# Patient Record
Sex: Female | Born: 1970 | Race: White | Hispanic: No | Marital: Married | Smoking: Never smoker
Health system: Southern US, Community
[De-identification: ages and names within clinical notes are randomized; demographics above are authoritative.]

## PROBLEM LIST (undated history)

## (undated) DIAGNOSIS — E079 Disorder of thyroid, unspecified: Secondary | ICD-10-CM

## (undated) DIAGNOSIS — R7989 Other specified abnormal findings of blood chemistry: Secondary | ICD-10-CM

## (undated) HISTORY — PX: BACK SURGERY: SHX140

## (undated) HISTORY — DX: Disorder of thyroid, unspecified: E07.9

---

## 2003-05-04 ENCOUNTER — Other Ambulatory Visit: Admission: RE | Admit: 2003-05-04 | Discharge: 2003-05-04 | Payer: Self-pay | Admitting: Obstetrics and Gynecology

## 2004-05-15 ENCOUNTER — Inpatient Hospital Stay (HOSPITAL_COMMUNITY): Admission: AD | Admit: 2004-05-15 | Discharge: 2004-05-18 | Payer: Self-pay | Admitting: Obstetrics and Gynecology

## 2004-05-20 ENCOUNTER — Inpatient Hospital Stay (HOSPITAL_COMMUNITY): Admission: AD | Admit: 2004-05-20 | Discharge: 2004-05-23 | Payer: Self-pay | Admitting: Obstetrics & Gynecology

## 2004-05-21 ENCOUNTER — Encounter: Payer: Self-pay | Admitting: Obstetrics & Gynecology

## 2004-06-17 ENCOUNTER — Other Ambulatory Visit: Admission: RE | Admit: 2004-06-17 | Discharge: 2004-06-17 | Payer: Self-pay | Admitting: Obstetrics & Gynecology

## 2009-12-13 ENCOUNTER — Encounter: Admission: RE | Admit: 2009-12-13 | Discharge: 2009-12-13 | Payer: Self-pay | Admitting: Internal Medicine

## 2010-07-22 ENCOUNTER — Encounter: Admission: RE | Admit: 2010-07-22 | Discharge: 2010-07-22 | Payer: Self-pay | Admitting: Internal Medicine

## 2011-04-14 NOTE — Discharge Summary (Signed)
Debbie Moran, Debbie Moran                        ACCOUNT NO.:  [id number]   MEDICAL RECORD NO.:  [id number]                   PATIENT TYPE:  OUT   LOCATION:  XRAY                                 FACILITY:  [facility]   PHYSICIAN:  Carrington Clamp, M.D.              DATE OF BIRTH:  [birth year]   DATE OF ADMISSION:  05/15/2004  DATE OF DISCHARGE:  05/18/2004                                 DISCHARGE SUMMARY   FINAL DIAGNOSES:  1. Intrauterine pregnancy at 38+ weeks gestation.  2. Mild preeclampsia.  3. Suspected macrosomia.   PROCEDURES:  Primary low transverse cesarean section.   SURGEON:  Gerrit Friends. Aldona Bar, M.D.   COMPLICATIONS:  None.   HISTORY OF PRESENT ILLNESS:  This 40 year old G2, P0 presents at [redacted] weeks  gestation with elevated blood pressures.  The patient had been referred from  the [facility] where she was seen that morning, in  addition to her elevated blood pressures, there was a clinical suspicion of  macrosomia.  This had been discussed with the patient at her office visit  last week which showed an estimated fetal weight of about 7 pounds 8 ounces.  This was discussed with the patient, and she had pretty much decided on a  primary low transverse cesarean section as her source of delivery.  The  patient's antepartum course had been complicated by a history of  thrombophilia.  The rest of her workup was essentially negative, but she was  on aspirin until early June.  She had not had any in the last three weeks.  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  Otherwise, the  patient's antepartum course really had been uncomplicated.  She was admitted  at this time.  She did have some 1+ lower extremity edema upon admission.  The patient's [facility] panel was essentially normal.  She was taken to  the  operating room on May 15, 2004, by Dr. Annamaria Helling where a primary low  transverse cesarean section was performed with the delivery of a 7 pound 14  ounce female infant with Apgars of 9 and 9.  Delivery went without  complications.  The patient's postoperative course was benign without  significant fevers.  Her blood pressures were still slightly elevated, but  no other symptoms were present.  The patient did desire her little boy to be  circumcised which was taken care of during her hospital stay.  She also was  having some shooting pain down her right leg and was seen by anesthesia.  The patient's questions were answered and were told to follow up with her if  the pain continued beyond 2 weeks.  By postoperative day #3, the patient's  blood pressures were  still elevated, but she was given the okay to be  discharged.  She was sent home on a regular diet with decreased activity,  told to continue her prenatal vitamins and her iron supplements one daily.  She was given Percocet 1-2 q.4h. p.r.n. pain.  Was to follow up in the  office with Dr. Henderson Cloud in 4 weeks, and of course, to call with any  increased pain or any temperatures.   DISCHARGE LABORATORIES:  PIH panel was within normal limits.  She had  hemoglobin of 8.6 on June 20, postoperatively, and white blood cell count of  10.9.  Platelets after delivery were 334,000.     Leilani Able, P.A.-C.                Carrington Clamp, M.D.    MB/MEDQ  D:  06/06/2004  T:  06/06/2004  Job:  [id number]

## 2011-04-14 NOTE — Consult Note (Signed)
NAMEJOLYNN, Debbie Moran                        ACCOUNT NO.:  [id number]   MEDICAL RECORD NO.:  [id number]                   PATIENT TYPE:  INP   LOCATION:  9373                                 FACILITY:  [facility]   PHYSICIAN:  Currie Paris, M.D.           DATE OF BIRTH:  1971-06-07   DATE OF CONSULTATION:  05/21/2004  DATE OF DISCHARGE:                                   CONSULTATION   REASON FOR CONSULTATION:  Questionable abdominal problem.   HISTORY OF PRESENT ILLNESS:  Debbie Moran is a 40 year old lady who is about 1  week status post cesarean section. This apparently was fairly routine. She  went home. Was doing well until yesterday afternoon about 1:30 when she  developed increasing abdominal discomfort across the entire upper abdomen,  more right upper quadrant that left upper quadrant. The pain seemed to go up  towards her right shoulder. Certain movements would increase it and send  pain sharply up into her right chest. Deep inspiration also hurt such that  she was taking shallow respirations. The patient felt like some of her pain  was gas and describes it all across the upper abdomen. None really in the  lower abdomen. She has had a small bowel movement today. Felt a little bit  better after that. The patient denies any nausea and vomiting. She has felt  warm and noted to be febrile since she arrived here. The pain has persisted  all day today. She feels much better sitting up and changing position from  sitting to supine on her back, which requires a great deal of effort and she  has to do this very slowly. She really has not had any other symptoms prior  to this. She has had no urinary tract symptoms. She had no blood in her  stools. She had no abnormal vaginal bleeding since her baby came a week ago.   PAST MEDICAL HISTORY:  Unremarkable, noted in the old chart. Not re-dictated  here.   REVIEW OF SYSTEMS:  Likewise basically unremarkable.   PHYSICAL EXAMINATION:   GENERAL:  The patient is alert, oriented,  uncomfortable appearing sitting in a chair. Slightly dyspneic.  VITAL SIGNS:  Have shown a persistent tachycardia with a tachypnea of about  25.  HEENT:  Eyes nonicteric. Pupils are equal, round, and regular.  NECK:  Supple. No masses or thyromegaly.  LUNGS:  She has some decreased breath sound bilaterally but basically clear.  Somewhat shallow respirations.  HEART:  Rate is regular. No murmur, rub, or gallop. Does have as noted, a  tachycardia.  ABDOMEN: Distended and bloated. The wound appears to be healing nicely from  her cesarean section (Pfannenstiel incision). Her abdomen seems soft without  a lot of guarding but pressure on her abdomen, almost just any place, causes  pain up in the right upper quadrant and up into the right chest. She has  question of some rebound but  not certain of this. Bowel sounds are present.  EXTREMITIES:  Just a little bit of peripheral edema. This is minimal.   LABORATORY DATA:  A pH of 745, pO2 of 87 (saturations have been around 97%).  CBC initially showed a white count of 24,000 and platelet of 600,000. She is  known to have a high platelet count in the past. Repeat CBC this afternoon,  white count was up to 29,000.   Chest x-ray showed some mild cardiomegaly and vascular congestion. A spiral  CT of the chest showed no evidence of pulmonary emboli. No evidence of  pneumonias or other vascular problems. The top of the liver was visible and  there may have been a tiny bit of fluid around it. The gallbladder was a  little bit distended but there is no evidence of inflammatory process around  that. The kidneys did function and only the tops of them were seen. No  significant abdominal process could be seen, as the CT really did not go low  enough. KUB just showed a mild ileus pattern, basically unremarkable. Pelvic  and upper abdominal ultrasounds are negative. There is no evidence of  gallstones and no evidence  of thick walled gallbladder or cholecystitis.   IMPRESSION:  Acute abdominal pain with radiation into the right chest of  uncertain etiology.   PLAN:  She is already on intravenous fluids and antibiotics. I have  recommended that we go ahead with a CT scan as the next step, to see if this  will help [place]. I have some concern that she could have perhaps ischemic bowel  with her high platelet count and perhaps, some coagulopathy increased  clotting disorder. A CT might be helpful for that. She does not appear to  have any, as noted, pneumonia, pulmonary embolus, etc. I will follow her  again, once her CT is done.                                               Currie Paris, M.D.    CJS/MEDQ  D:  05/21/2004  T:  05/22/2004  Job:  [id number]

## 2011-04-14 NOTE — Op Note (Signed)
[name], Debbie Moran                        ACCOUNT NO.:  [id number]   MEDICAL RECORD NO.:  [id number]                   PATIENT TYPE:  INP   LOCATION:  9198                                 FACILITY:  [facility]   PHYSICIAN:  Gerrit Friends. Aldona Bar, M.D.                DATE OF BIRTH:  10-03-1971   DATE OF PROCEDURE:  05/15/2004  DATE OF DISCHARGE:                                 OPERATIVE REPORT   PREOPERATIVE DIAGNOSES:  1. Thirty-eight week to 39-week intrauterine pregnancy.  2. Mild preeclampsia.  3. Suspected macrosomia.   POSTOPERATIVE DIAGNOSES:  1. Thirty-eight week to 39-week intrauterine pregnancy.  2. Mild preeclampsia.  3. Suspected macrosomia.  4. Delivery of a 7 pound 14 ounce female infant, Apgars 9 and 9.   SURGEON:  Gerrit Friends. Aldona Bar, M.D.   PROCEDURE:  Primary low transverse cesarean section.   ANESTHESIA:  Spinal, Dorinda Hill T. Pamalee Leyden, M.D.   HISTORY:  This 40 year old gravida 2, para 0, was seen by me in triage for  continued evaluation of mild preeclampsia, having been referred from the  [facility] urgent care center, where she was seen this morning.  In  addition to mild preeclampsia, there was a clinical suspicion of macrosomia  and, indeed, this had been discussed with the patient in the office last  week in detail after an ultrasound two weeks ago suggested estimated fetal  weight of 7 pounds 8 ounces.  The patient was pretty much decided on a  primary low transverse cesarean section for delivery, and this as well as  induction were discussed thoroughly in the maternity admissions unit and  cesarean section decided upon.  She is taken to the operating room now for a  primary low transverse cesarean section.   PROCEDURE:  The patient was taken to the operating room, where after the  satisfactory induction of a spinal anesthetic by Dr. Pamalee Leyden, she was prepped  and draped in the usual fashion having been placed in the supine position  slightly tilted to the left and a  Foley catheter was inserted as part of the  prep.   After the patient was adequately draped and good anesthetic levels were  documented, the procedure was begun.  A Pfannenstiel incision was made, with  minimal difficulty dissected down sharply to and through the fascia in a low  transverse fashion.  Subfascial space was created inferiorly and superiorly,  muscles separated in the midline, peritoneum identified and entered  appropriately with care taken to avoid the bowel superiorly and the bladder  inferiorly.  At this time the bladder blade was placed and the vesicouterine  peritoneum was incised in a low transverse fashion and pushed off the lower  uterine segment with ease.  Sharp incision in the uterus with the Metzenbaum  scissors was then made in a low transverse fashion.  Amniotomy was produced  with production of clear fluid, and the incision was extended  laterally with  the fingers.  Thereafter, with the aid of a vacuum extractor a viable female  infant which cried spontaneously at once was delivered from the vertex  position.  After the cord was clamped and cut, the infant was passed off to  the awaiting team and subsequently taken to the nursery in good condition.  Apgars were noted to be 9 and 9, and subsequent weight was found to be 7  pounds 14 ounces.   After the cord bloods were collected, the placenta was delivered intact.  The uterus was then exteriorized and good uterine contractility was afforded  with slowly-given intravenous Pitocin and manual stimulation.  At this time  closure of the uterine incision was carried out using a single layer of #1  Vicryl in a running locking fashion, and this was oversewn with several  figure-of-eight #1 Vicryls.  The vesicouterine peritoneum reflection was  reapproximated as well using several interrupted 0 Vicryls.  Tubes and  ovaries were inspected and noted to be normal and with the uterine incision  hemostatic, the abdomen was  lavaged of all free blood and clot and then the  uterus was replaced into the abdominal cavity.  At this point all counts  were noted to be correct and no foreign bodies were noted to be remaining in  the abdominal cavity.  Closure of the abdomen at this time was begun in  layers.  The abdominal peritoneum was closed with 0 Vicryl in a running  fashion and muscle secured with same.  Assured of good subfascial  hemostasis, the fascia was then reapproximated using 0 Vicryl from angle to  the midline bilaterally.  Subcutaneous tissue was rendered hemostatic and  staples were then used to close the skin.  A sterile pressure dressing was  applied, and the patient was taken to the recovery room thereafter having  tolerated the procedure well.  Estimated blood loss 500 mL.  All counts  correct x2.  At the conclusion of the procedure both mother and baby were  doing well in their respective recovery areas.   In summary, this patient presented with mild preeclampsia at 38 to 39 weeks'  gestation and a clinical suspicion for macrosomia.  A discussion with the  patient and her husband was carried out as had been carried out last week in  the office, and a decision was made to proceed with a primary low transverse  cesarean section primarily because of suspected macrosomia.  The procedure  was carried out without incident, and, as mentioned at the conclusion of the  procedure, both mother and baby were doing well in their respective recovery  areas.                                               Gerrit Friends. Aldona Bar, M.D.    RMW/MEDQ  D:  05/15/2004  T:  05/16/2004  Job:  [id number]

## 2011-04-14 NOTE — Discharge Summary (Signed)
Debbie Moran, Debbie Moran                        ACCOUNT NO.:  [id number]   MEDICAL RECORD NO.:  [id number]                   PATIENT TYPE:  INP   LOCATION:  9373                                 FACILITY:  [facility]   PHYSICIAN:  Gerrit Friends. Aldona Bar, M.D.                DATE OF BIRTH:  10-23-1971   DATE OF ADMISSION:  05/20/2004  DATE OF DISCHARGE:                                 DISCHARGE SUMMARY   DISCHARGE DIAGNOSIS:  Postpartum endometritis (?) presenting as right upper  quadrant pain, tachypnea, elevated white count, and fever.   SUMMARY:  This 40 year old patient who underwent a cesarean section on May 15, 2004 presented on the evening of June 24 with right upper quadrant pain,  tachypnea, tachycardia, and leucocytosis.  She had been discharged from the  hospital several days earlier.  On evaluation it seemed that most of her  discomfort was in her right upper quadrant area.  She underwent a very  thorough workup including a spiral CT to rule out a pulmonary embolus, an  abdominal and pelvic ultrasound, an abdominal CT scan, surgical  consultation, and cardiology consultation.  She was placed on intravenous  Unasyn 3 g q.6h. intravenously and also given Lasix to help her diurese and  potassium to correct her hypokalemia, and improved markedly over the course  of her hospital stay.  Her blood cultures and urine culture on the morning  of June 27 were negative.  Other than her hemoglobin and white count and low  potassium, her labs were within normal limits.  Her white count which  initially was 29,400 on June 25 dropped to 10,800 on the morning of June 27  and her hemoglobin remained stable at 7.9.  Her platelet count was still  elevated.  Her basic metabolic profile on the morning of June 27 was normal.  As mentioned, the patient improved dramatically once she was placed on  Unasyn and IV fluids and Lasix.  She was doing so well on the morning of  June 27 that she and her husband both were  desirous of discharge and after  her noon dose of Unasyn intravenously she was discharged to home to continue  on Augmentin XR 1000 mg q.12h. for a total of 5 more days.  She was  cautioned on what to watch out for and what to call about.  She will  continue her postpartum care at home and return to the office as scheduled  for her postpartum visit.   As mentioned, with the response of her fever and her white count to  intravenous Unasyn, infection probably was the etiology of her problem;  however, her presentation was indeed unusual.  Her workup was very thorough  and as mentioned included consultations with general surgery and cardiology.   CONDITION ON DISCHARGE:  Improved.  Gerrit Friends. Aldona Bar, M.D.    RMW/MEDQ  D:  05/23/2004  T:  05/23/2004  Job:  [id number]   cc:   Currie Paris, M.D.  [address], [address]  [place]  [place] [address]  Fax: [phone]   Lyn Records III, M.D.  [address]  [place]  [place] [address]  Fax: [phone]   Ilda Mori, M.D.  [address]  [place], [place] [address]  Fax: [phone]

## 2011-12-18 ENCOUNTER — Emergency Department (HOSPITAL_COMMUNITY): Payer: BC Managed Care – PPO | Admitting: Anesthesiology

## 2011-12-18 ENCOUNTER — Encounter (HOSPITAL_COMMUNITY): Payer: Self-pay | Admitting: Emergency Medicine

## 2011-12-18 ENCOUNTER — Inpatient Hospital Stay (HOSPITAL_COMMUNITY)
Admission: EM | Admit: 2011-12-18 | Discharge: 2011-12-19 | DRG: 883 | Disposition: A | Discharge status: Home / Self Care | Payer: BC Managed Care – PPO | Attending: General Surgery | Admitting: General Surgery

## 2011-12-18 ENCOUNTER — Encounter (HOSPITAL_COMMUNITY)
Admission: EM | Disposition: A | Discharge status: Home / Self Care | Payer: Self-pay | Source: Home / Self Care | Attending: Surgery

## 2011-12-18 ENCOUNTER — Encounter (HOSPITAL_COMMUNITY): Payer: Self-pay | Admitting: Anesthesiology

## 2011-12-18 ENCOUNTER — Other Ambulatory Visit: Payer: Self-pay

## 2011-12-18 ENCOUNTER — Other Ambulatory Visit (INDEPENDENT_AMBULATORY_CARE_PROVIDER_SITE_OTHER): Payer: Self-pay | Admitting: Surgery

## 2011-12-18 DIAGNOSIS — R1011 Right upper quadrant pain: Secondary | ICD-10-CM

## 2011-12-18 DIAGNOSIS — K3580 Unspecified acute appendicitis: Secondary | ICD-10-CM

## 2011-12-18 DIAGNOSIS — R1031 Right lower quadrant pain: Secondary | ICD-10-CM

## 2011-12-18 DIAGNOSIS — K37 Unspecified appendicitis: Secondary | ICD-10-CM | POA: Diagnosis present

## 2011-12-18 DIAGNOSIS — D473 Essential (hemorrhagic) thrombocythemia: Secondary | ICD-10-CM | POA: Diagnosis present

## 2011-12-18 HISTORY — PX: LAPAROSCOPIC APPENDECTOMY: SHX408

## 2011-12-18 HISTORY — DX: Other specified abnormal findings of blood chemistry: R79.89

## 2011-12-18 LAB — URINALYSIS, ROUTINE W REFLEX MICROSCOPIC
Bilirubin Urine: NEGATIVE
Hgb urine dipstick: NEGATIVE
Ketones, ur: NEGATIVE mg/dL
Protein, ur: NEGATIVE mg/dL
Specific Gravity, Urine: <1.005 — ABNORMAL LOW (ref 1.005–1.030)
Urobilinogen, UA: 0.2 mg/dL (ref 0.0–1.0)

## 2011-12-18 LAB — CBC
HCT: 39.7 % (ref 36.0–46.0)
Hemoglobin: 13.3 g/dL (ref 12.0–15.0)
MCH: 28.2 pg (ref 26.0–34.0)
MCHC: 33.5 g/dL (ref 30.0–36.0)
MCV: 84.3 fL (ref 78.0–100.0)
Platelets: 508 10*3/uL — ABNORMAL HIGH (ref 150–400)
RDW: 13.5 % (ref 11.5–15.5)

## 2011-12-18 LAB — COMPREHENSIVE METABOLIC PANEL
Alkaline Phosphatase: 128 U/L — ABNORMAL HIGH (ref 39–117)
Calcium: 9.4 mg/dL (ref 8.4–10.5)
Creatinine, Ser: 0.66 mg/dL (ref 0.50–1.10)
GFR calc non Af Amer: >90 mL/min (ref >90)
Sodium: 134 mEq/L — ABNORMAL LOW (ref 135–145)

## 2011-12-18 SURGERY — APPENDECTOMY, LAPAROSCOPIC
Anesthesia: General | Site: Abdomen | Wound class: Clean Contaminated

## 2011-12-18 MED ORDER — MORPHINE SULFATE 4 MG/ML IJ SOLN
4.0000 mg | Freq: Once | INTRAMUSCULAR | Status: AC
  Administered 2011-12-18: 4 mg via INTRAVENOUS
  Filled 2011-12-18: qty 1

## 2011-12-18 MED ORDER — MIDAZOLAM HCL 5 MG/5ML IJ SOLN
INTRAMUSCULAR | Status: DC | PRN
  Administered 2011-12-18: 2 mg via INTRAVENOUS

## 2011-12-18 MED ORDER — DEXAMETHASONE SODIUM PHOSPHATE 4 MG/ML IJ SOLN
INTRAMUSCULAR | Status: DC | PRN
  Administered 2011-12-18: 4 mg via INTRAVENOUS

## 2011-12-18 MED ORDER — HYDROCODONE-ACETAMINOPHEN 5-325 MG PO TABS: 1.0000 | ORAL_TABLET | ORAL | Status: DC | PRN

## 2011-12-18 MED ORDER — GLYCOPYRROLATE 0.2 MG/ML IJ SOLN
INTRAMUSCULAR | Status: DC | PRN
  Administered 2011-12-18: .4 mg via INTRAVENOUS

## 2011-12-18 MED ORDER — IBUPROFEN 600 MG PO TABS
600.0000 mg | ORAL_TABLET | Freq: Four times a day (QID) | ORAL | Status: DC | PRN
  Filled 2011-12-18: qty 1

## 2011-12-18 MED ORDER — NEOSTIGMINE METHYLSULFATE 1 MG/ML IJ SOLN
INTRAMUSCULAR | Status: DC | PRN
  Administered 2011-12-18: 2 mg via INTRAVENOUS

## 2011-12-18 MED ORDER — SUCCINYLCHOLINE CHLORIDE 20 MG/ML IJ SOLN
INTRAMUSCULAR | Status: DC | PRN
  Administered 2011-12-18: 100 mg via INTRAVENOUS

## 2011-12-18 MED ORDER — DROPERIDOL 2.5 MG/ML IJ SOLN
INTRAMUSCULAR | Status: DC | PRN
  Administered 2011-12-18: .6 mg via INTRAVENOUS

## 2011-12-18 MED ORDER — LEVOTHYROXINE SODIUM 50 MCG PO TABS
50.0000 ug | ORAL_TABLET | Freq: Every day | ORAL | Status: DC
  Administered 2011-12-19: 50 ug via ORAL
  Filled 2011-12-18 (×2): qty 1

## 2011-12-18 MED ORDER — ACETAMINOPHEN 10 MG/ML IV SOLN
INTRAVENOUS | Status: AC
  Filled 2011-12-18: qty 100

## 2011-12-18 MED ORDER — DEXTROSE 5 % IV SOLN
1.0000 g | INTRAVENOUS | Status: DC | PRN
  Administered 2011-12-18: 2 g via INTRAVENOUS

## 2011-12-18 MED ORDER — ACETAMINOPHEN 10 MG/ML IV SOLN
INTRAVENOUS | Status: DC | PRN
  Administered 2011-12-18: 1000 mg via INTRAVENOUS

## 2011-12-18 MED ORDER — SODIUM CHLORIDE 0.9 % IR SOLN
Status: DC | PRN
  Administered 2011-12-18: 1

## 2011-12-18 MED ORDER — ONDANSETRON HCL 4 MG/2ML IJ SOLN
INTRAMUSCULAR | Status: DC | PRN
  Administered 2011-12-18: 4 mg via INTRAVENOUS

## 2011-12-18 MED ORDER — KCL IN DEXTROSE-NACL 20-5-0.45 MEQ/L-%-% IV SOLN
INTRAVENOUS | Status: DC
  Administered 2011-12-18: via INTRAVENOUS
  Filled 2011-12-18 (×3): qty 1000

## 2011-12-18 MED ORDER — ONDANSETRON HCL 4 MG PO TABS: 4.0000 mg | ORAL_TABLET | Freq: Four times a day (QID) | ORAL | Status: DC | PRN

## 2011-12-18 MED ORDER — BUPIVACAINE HCL (PF) 0.25 % IJ SOLN
INTRAMUSCULAR | Status: DC | PRN
  Administered 2011-12-18: 20 mL

## 2011-12-18 MED ORDER — VECURONIUM BROMIDE 10 MG IV SOLR
INTRAVENOUS | Status: DC | PRN
  Administered 2011-12-18: 3 mg via INTRAVENOUS
  Administered 2011-12-18: 1 mg via INTRAVENOUS

## 2011-12-18 MED ORDER — CEFOXITIN SODIUM 1 G IV SOLR
1.0000 g | Freq: Four times a day (QID) | INTRAVENOUS | Status: DC
  Administered 2011-12-18 – 2011-12-19 (×2): 1 g via INTRAVENOUS
  Filled 2011-12-18 (×4): qty 1

## 2011-12-18 MED ORDER — ONDANSETRON HCL 4 MG/2ML IJ SOLN: 4.0000 mg | Freq: Four times a day (QID) | INTRAMUSCULAR | Status: DC | PRN

## 2011-12-18 MED ORDER — MORPHINE SULFATE 2 MG/ML IJ SOLN
1.0000 mg | INTRAMUSCULAR | Status: DC | PRN
  Administered 2011-12-18 – 2011-12-19 (×3): 2 mg via INTRAVENOUS
  Filled 2011-12-18 (×3): qty 1

## 2011-12-18 MED ORDER — ONDANSETRON HCL 4 MG/2ML IJ SOLN: 4.0000 mg | Freq: Once | INTRAMUSCULAR | Status: DC | PRN

## 2011-12-18 MED ORDER — PROPOFOL 10 MG/ML IV BOLUS
INTRAVENOUS | Status: DC | PRN
  Administered 2011-12-18: 200 mg via INTRAVENOUS

## 2011-12-18 MED ORDER — FENTANYL CITRATE 0.05 MG/ML IJ SOLN: INTRAMUSCULAR | Status: DC | PRN

## 2011-12-18 MED ORDER — FENTANYL CITRATE 0.05 MG/ML IJ SOLN
INTRAMUSCULAR | Status: DC | PRN
Start: 2011-12-18 — End: 2011-12-18
  Administered 2011-12-18: 75 ug via INTRAVENOUS
  Administered 2011-12-18: 175 ug via INTRAVENOUS
  Administered 2011-12-18: 100 ug via INTRAVENOUS

## 2011-12-18 MED ORDER — EPHEDRINE SULFATE 50 MG/ML IJ SOLN
INTRAMUSCULAR | Status: DC | PRN
  Administered 2011-12-18: 15 mg via INTRAVENOUS

## 2011-12-18 MED ORDER — HYDROMORPHONE HCL PF 1 MG/ML IJ SOLN: 0.2500 mg | INTRAMUSCULAR | Status: DC | PRN

## 2011-12-18 MED ORDER — HEPARIN SODIUM (PORCINE) 5000 UNIT/ML IJ SOLN
5000.0000 [IU] | Freq: Three times a day (TID) | INTRAMUSCULAR | Status: DC
  Administered 2011-12-19: 5000 [IU] via SUBCUTANEOUS
  Filled 2011-12-18 (×4): qty 1

## 2011-12-18 MED ORDER — LACTATED RINGERS IV SOLN
INTRAVENOUS | Status: DC | PRN
  Administered 2011-12-18 (×3): via INTRAVENOUS

## 2011-12-18 MED ORDER — ACETAMINOPHEN 10 MG/ML IV SOLN
1000.0000 mg | Freq: Four times a day (QID) | INTRAVENOUS | Status: DC
  Administered 2011-12-18 – 2011-12-19 (×2): 1000 mg via INTRAVENOUS
  Filled 2011-12-18 (×4): qty 100

## 2011-12-18 SURGICAL SUPPLY — 41 items
ADH SKN CLS APL DERMABOND .7 (GAUZE/BANDAGES/DRESSINGS) ×1
APPLIER CLIP ROT 10 11.4 M/L (STAPLE)
APR CLP MED LRG 11.4X10 (STAPLE)
BAG SPEC RTRVL LRG 6X4 10 (ENDOMECHANICALS) ×1
BLADE SURG ROTATE 9660 (MISCELLANEOUS) IMPLANT
CANISTER SUCTION 2500CC (MISCELLANEOUS) ×1 IMPLANT
CHLORAPREP W/TINT 26ML (MISCELLANEOUS) ×2 IMPLANT
CLIP APPLIE ROT 10 11.4 M/L (STAPLE) IMPLANT
CLOTH BEACON ORANGE TIMEOUT ST (SAFETY) ×2 IMPLANT
COVER SURGICAL LIGHT HANDLE (MISCELLANEOUS) ×2 IMPLANT
CUTTER FLEX LINEAR 45M (STAPLE) ×1 IMPLANT
DERMABOND ADVANCED (GAUZE/BANDAGES/DRESSINGS) ×1
DERMABOND ADVANCED .7 DNX12 (GAUZE/BANDAGES/DRESSINGS) ×1 IMPLANT
ELECT REM PT RETURN 9FT ADLT (ELECTROSURGICAL) ×2
ELECTRODE REM PT RTRN 9FT ADLT (ELECTROSURGICAL) ×1 IMPLANT
ENDOLOOP SUT PDS II  0 18 (SUTURE)
ENDOLOOP SUT PDS II 0 18 (SUTURE) IMPLANT
GLOVE SURG SIGNA 7.5 PF LTX (GLOVE) ×2 IMPLANT
GOWN STRL NON-REIN LRG LVL3 (GOWN DISPOSABLE) ×4 IMPLANT
GOWN STRL REIN XL XLG (GOWN DISPOSABLE) ×2 IMPLANT
KIT BASIN OR (CUSTOM PROCEDURE TRAY) ×2 IMPLANT
KIT ROOM TURNOVER OR (KITS) ×2 IMPLANT
NS IRRIG 1000ML POUR BTL (IV SOLUTION) ×2 IMPLANT
PAD ARMBOARD 7.5X6 YLW CONV (MISCELLANEOUS) ×4 IMPLANT
POUCH SPECIMEN RETRIEVAL 10MM (ENDOMECHANICALS) ×2 IMPLANT
RELOAD 45 VASCULAR/THIN (ENDOMECHANICALS) IMPLANT
RELOAD STAPLE TA45 3.5 REG BLU (ENDOMECHANICALS) ×2 IMPLANT
SCALPEL HARMONIC ACE (MISCELLANEOUS) ×2 IMPLANT
SET IRRIG TUBING LAPAROSCOPIC (IRRIGATION / IRRIGATOR) ×2 IMPLANT
SLEEVE Z-THREAD 5X100MM (TROCAR) IMPLANT
SPECIMEN JAR SMALL (MISCELLANEOUS) ×2 IMPLANT
SUT VIC AB 5-0 PS2 18 (SUTURE) ×2 IMPLANT
SUT VICRYL 0 UR6 27IN ABS (SUTURE) ×1 IMPLANT
TOWEL OR 17X24 6PK STRL BLUE (TOWEL DISPOSABLE) ×2 IMPLANT
TOWEL OR 17X26 10 PK STRL BLUE (TOWEL DISPOSABLE) ×2 IMPLANT
TRAY FOLEY CATH 14FR (SET/KITS/TRAYS/PACK) ×2 IMPLANT
TRAY LAPAROSCOPIC (CUSTOM PROCEDURE TRAY) ×2 IMPLANT
TROCAR XCEL BLUNT TIP 100MML (ENDOMECHANICALS) ×2 IMPLANT
TROCAR Z-THREAD FIOS 11X100 BL (TROCAR) ×2 IMPLANT
TROCAR Z-THREAD FIOS 5X100MM (TROCAR) ×2 IMPLANT
WATER STERILE IRR 1000ML POUR (IV SOLUTION) IMPLANT

## 2011-12-18 NOTE — ED Notes (Signed)
Pt c/o lower abd pain x's 3 days.  Pt was sent here after CT scan ref. appendicitis

## 2011-12-18 NOTE — Op Note (Signed)
Debbie Moran, CHIEM              ACCOUNT NO.:  [id number]  MEDICAL RECORD NO.:  [id number]  LOCATION:  [facility]                         FACILITY:  [facility]  PHYSICIAN:  Sandria Bales. Ezzard Standing, M.D.  DATE OF BIRTH:  Dec 27, 1970  DATE OF PROCEDURE:  12/18/2011                              OPERATIVE REPORT  PREOPERATIVE DIAGNOSIS:  Appendicitis  POSTOPERATIVE DIAGNOSIS:  Acute suppurative appendicitis.  PROCEDURE:  Laparoscopic appendectomy.  SURGEON:  Sandria Bales. Ezzard Standing, MD  ASSISTANT:  No first assistant.  ANESTHESIA:  General endotracheal.  ESTIMATED BLOOD LOSS:  Minimal.  INDICATIONS FOR PROCEDURE:  Ms. Bjelland is a 41 year old white female who sees Dr. Juline Patch, as her primary medical doctor.  She developed abdominal pain over this past weekend at the beach, came back, had severe enough pain that she saw Dr. Claudean Severance of [facility] in [place], who obtained a CT scan at [facility] which revealed appendicitis.  She came to the [facility] Emergency Room where I was consulted for evaluation.  I discussed with the patient, indications, potential complications of appendiceal surgery.  The potential complications include, but are not limited to, bleeding, open surgery, and the possibility of another diagnosis.  OPERATIVE NOTE:  The patient underwent a general endotracheal anesthetic as supervised by Dr. Hart Robinsons, room #16.  She was given 2 g of cefoxitin initially at the procedure.  Her abdomen was prepped with ChloraPrep.  A time-out was held and surgical checklist run.  An infraumbilical incision was made with sharp dissection carried down to the abdominal cavity.  A 0 degree 10 mm laparoscope was inserted through a 12 mm Hasson trocar and Hasson trocar secured with a 0 Vicryl suture.  I placed a 5 mm trocar in the right upper quadrant and 11 mm in left lower quadrant and did abdominal exploration.  The right left lobes of liver  unremarkable.  Stomach was unremarkable.  Her pelvic organs were unremarkable.  She actually had a history of an infection post C- section, but really I saw no stigmata of this.  She did have an acute suppurative appendicitis stuck up to her anterior abdominal wall and right pelvic brim and stuck to the sigmoid colon. This was taken down with blunt dissection.  She had very little mesentery of the appendix, I took down what little mesentery there was with a Harmonic scalpel.  She did have some thickening on the base of the appendix.  I used a blue load 45 mm Ethicon Endo-GIA stapler and fired this across the base of the appendix.  She did have bleeding through part of the staple line. This was controlled with Harmonic scalpel.  I placed the appendix in EndoCatch bag and liver through the umbilicus, but I was not comfortable because the bleeding at least was brisk enough that I oversewn this is area of bleeding with a figure-of-eight 2-0 Vicryl suture.  I then irrigated it thoroughly and saw no further bleeding.  Her purulence was limited to the suppuration of the appendix.  She did not rupture this appendix.  After irrigating the abdomen, I then removed the trocars, in turn the umbilical port was closed with 0 Vicryl suture.  I had to put an additional 0 Vicryl suture to close this.  I closed the skin each site with a 5-0 Vicryl suture, painting the wounds with Dermabond and sterilely dressed.  I then injected about 30 mL of 0.25% Marcaine at the wounds.  She tolerated procedure well and depends how she does clinically as when she could be discharged.   Sandria Bales. Ezzard Standing, M.D., FACS  DHN/MEDQ  D:  12/18/2011  T:  12/18/2011  Job:  [id number]  cc:   Juline Patch, M.D. Carrington Clamp, M.D. Claudean Severance, MD

## 2011-12-18 NOTE — ED Provider Notes (Signed)
I saw and evaluated the patient, reviewed the resident's note and I agree with the findings and plan.  Seen and examined. X-rays reviewed and patient has appendicitis. General surgery has been called and they will see patient  Toy Baker, MD 12/18/11 1821

## 2011-12-18 NOTE — ED Provider Notes (Signed)
History     CSN: [id number]  Arrival date & time 12/18/11  1624   First MD Initiated Contact with Patient 12/18/11 1701      Chief Complaint  Patient presents with  . Abdominal Pain    (Consider location/radiation/quality/duration/timing/severity/associated sxs/prior treatment) Patient is a 41 y.o. female presenting with abdominal pain. The history is provided by the patient.  Abdominal Pain The primary symptoms of the illness include abdominal pain and nausea. The primary symptoms of the illness do not include fever, shortness of breath, vomiting or diarrhea. The current episode started 2 days ago. The onset of the illness was gradual. The problem has been gradually worsening.  The abdominal pain is located in the RLQ and periumbilical region. The abdominal pain does not radiate. The severity of the abdominal pain is 6/10. The abdominal pain is relieved by nothing. The abdominal pain is exacerbated by urination and movement.  The patient has had a change in bowel habit (two loose stools today).    Past Medical History  Diagnosis Date  . High platelet count     Past Surgical History  Procedure Date  . Back surgery   . Cesarean section     No family history on file.  History  Substance Use Topics  . Smoking status: Never Smoker   . Smokeless tobacco: Not on file  . Alcohol Use: No    OB History    Grav Para Term Preterm Abortions TAB SAB Ect Mult Living                  Review of Systems  Constitutional: Negative for fever.  HENT: Negative for congestion.   Respiratory: Negative for cough and shortness of breath.   Cardiovascular: Negative for chest pain.  Gastrointestinal: Positive for nausea and abdominal pain. Negative for vomiting and diarrhea.  Genitourinary: Negative for difficulty urinating.  All other systems reviewed and are negative.    Allergies  Review of patient's allergies indicates no known allergies.  Home Medications   Current Outpatient  Rx  Name Route Sig Dispense Refill  . ASPIRIN EC 81 MG PO TBEC Oral Take 81 mg by mouth daily.    Marland Kitchen LEVOTHYROXINE SODIUM 50 MCG PO TABS Oral Take 50 mcg by mouth daily.      BP 137/88  Pulse 90  Temp(Src) 98.3 F (36.8 C) (Oral)  Resp 16  SpO2 95%  LMP 12/12/2011  Physical Exam  Nursing note and vitals reviewed. Constitutional: She is oriented to person, place, and time. She appears well-developed and well-nourished. No distress.  HENT:  Head: Normocephalic and atraumatic.  Mouth/Throat: Oropharynx is clear and moist.  Eyes: Conjunctivae are normal. Pupils are equal, round, and reactive to light. No scleral icterus.  Neck: Neck supple.  Cardiovascular: Normal rate, regular rhythm, normal heart sounds and intact distal pulses.   No murmur heard. Pulmonary/Chest: Effort normal and breath sounds normal. No stridor. No respiratory distress. She has no rales.  Abdominal: Soft. Bowel sounds are normal. She exhibits no distension. There is tenderness in the right lower quadrant. There is rebound. There is no rigidity, no guarding and no CVA tenderness.  Musculoskeletal: Normal range of motion.  Neurological: She is alert and oriented to person, place, and time.  Skin: Skin is warm and dry. No rash noted.  Psychiatric: She has a normal mood and affect. Her behavior is normal.    ED Course  Procedures (including critical care time)  Labs Reviewed  CBC - Abnormal; Notable  for the following:    WBC 19.4 (*)    Platelets 508 (*)    All other components within normal limits  COMPREHENSIVE METABOLIC PANEL - Abnormal; Notable for the following:    Sodium 134 (*)    Potassium 3.4 (*)    Alkaline Phosphatase 128 (*)    All other components within normal limits  URINALYSIS, ROUTINE W REFLEX MICROSCOPIC - Abnormal; Notable for the following:    Specific Gravity, Urine <1.005 (*)    All other components within normal limits  POCT PREGNANCY, URINE  POCT PREGNANCY, URINE   No results  found.   1. Acute appendicitis       MDM  41 yo female with onset of abd pain 2 days ago, gradually worsening.  Sharp, located in RLQ.  VSS on arrival.  Abd tender in RLQ.  Positive rebound.  Seen at urgent care today and got CT scan that shows acute appendicitis.  Will check labs and consult surgery.    Labs show leukocytosis.  Urine negative.  Admitted to surgery.        Warnell Forester, MD 12/18/11 2348

## 2011-12-18 NOTE — H&P (Addendum)
Re:   Debbie Moran DOB:   26-Oct-1971 MRN:   [id number]  ASSESSMENT AND PLAN: 1.  Appendicitis  CT scan done in [place]  I discussed with the patient the indications and risks of appendiceal surgery.  The primary risks of appendiceal surgery include, but are not limited to, bleeding, infection, bowel surgery, and open surgery.  There is also the risk that the patient may have continued symptoms after surgery.  However, the likelihood of improvement in symptoms and return to the patient's normal status is good. We discussed the typical post-operative recovery course. I tried to answer the patient's questions.  Her husband and sister were at the bedside with the patient.  2.  Thrombocytosis - chronic, probably hereditary 3.  Back surgery - 2012 by Dr. Jola Babinski 4.  On thyroid replacement. 5.  History of HTN, but off meds now.  Questionably related to BCP.  Chief Complaint  Patient presents with  . Abdominal Pain   REFERRING PHYSICIAN:   Warnell Forester, MD,  [facility].  HISTORY OF PRESENT ILLNESS: Debbie Moran is a 41 y.o. (DOB: 1971-06-01)  white female whose primary care physician is Juline Patch, M.D., and comes from the [facility] ER with appendicitis.  Patient was at the beach this weekend.  Started feeling poorly Saturday (12/16/2011).  Came back from beach feeling worse yesterday.  Today woke up with very tender abdomen.  She saw Dr. Claudean Severance at [facility] in [place].  She was sent to [facility] for CT scan.  When CT scan was positive, she was sent to [facility] ER.  Patient has no history of stomach disease.  No history of liver disease.  No history of gall bladder disease.  No history of pancreas disease.  No history of colon disease.  She did have an infection after her C section.  Unsure of pathogen.  No further problems.    Past Medical History  Diagnosis Date  . High platelet count       Past Surgical History  Procedure Date  . Back surgery   . Cesarean section        Current Facility-Administered Medications  Medication Dose Route Frequency Provider Last Rate Last Dose  . morphine 4 MG/ML injection 4 mg  4 mg Intravenous Once Warnell Forester, MD   4 mg at 12/18/11 1803   Facility-Administered Medications Ordered in Other Encounters  Medication Dose Route Frequency Provider Last Rate Last Dose  . lactated ringers infusion    Continuous PRN Alanda Amass, CRNA         No Known Allergies  REVIEW OF SYSTEMS: Skin:  No history of rash.  No history of abnormal moles. Infection:  Had infection after C section.  No further problems. Neurologic:  No history of stroke.  No history of seizure.  No history of headaches. Cardiac:  No history of hypertension. No history of heart disease.  No history of prior cardiac catheterization.  No history of seeing a cardiologist. Pulmonary:  Does not smoke cigarettes.  No asthma or bronchitis.  No OSA/CPAP.  Endocrine:  No diabetes. On thyroid replacement. Gastrointestinal:  See HPI. Urologic:  No history of kidney stones.  No history of bladder infections. GYN:  Dr. Henderson Cloud. Musculoskeletal:  Had back surgery by Dr. Jola Babinski about 8 months ago.  Doing okay. Hematologic:  History of thrombocytosis.  Has never had DVT.  Works up at [employer] many years ago. Psycho-social:  The patient is oriented.  The patient has no obvious psychologic or social impairment to understanding our conversation and plan.  SOCIAL and FAMILY HISTORY: Married.  Has one son.  Stays at home. Sister had DVT.  PHYSICAL EXAM: BP 137/88  Pulse 90  Temp(Src) 98.3 F (36.8 C) (Oral)  Resp 16  SpO2 95%  LMP 12/12/2011  General: WN WF who is alert and generally healthy appearing.  HEENT: Normal. Pupils equal. Good dentition. Neck: Supple. No mass.  No thyroid mass.  Carotid pulse okay with no bruit. Lymph Nodes:  No supraclavicular or cervical nodes. Lungs: Clear to auscultation and symmetric breath sounds. Heart:  RRR. No murmur or  rub.  Abdomen: Soft. No mass. Tender RLQ.  Pfannenstiel incision.  No hernia.  BS present. Rectal: Not done. Extremities:  Good strength and ROM  in upper and lower extremities. Neurologic:  Grossly intact to motor and sensory function. Psychiatric: Has normal mood and affect. Behavior is normal.   DATA REVIEWED: WBC - 19,400 Platelets - 508,000 Alk Phos - 128 CT scan report from [place].  Ovidio Kin, MD,  [facility] [address] [place]  [address]   [place], [place]    [address] Phone:  [phone] FAX:  [phone]

## 2011-12-18 NOTE — ED Notes (Signed)
Pt states she has had pain x3 days. Went to urgent care earlier today and was sent to ED by urgent care. MD in room assessing pt.

## 2011-12-18 NOTE — Anesthesia Procedure Notes (Signed)
Procedure Name: Intubation Date/Time: 12/18/2011 8:01 PM Performed by: Alanda Amass Pre-anesthesia Checklist: Patient identified, Timeout performed, Emergency Drugs available, Suction available and Patient being monitored Patient Re-evaluated:Patient Re-evaluated prior to inductionOxygen Delivery Method: Circle System Utilized Preoxygenation: Pre-oxygenation with 100% oxygen Intubation Type: IV induction Laryngoscope Size: Mac and 3 Grade View: Grade I Tube type: Oral Tube size: 7.5 mm Number of attempts: 1 Airway Equipment and Method: stylet Secured at: 21 cm Tube secured with: Tape Dental Injury: Teeth and Oropharynx as per pre-operative assessment

## 2011-12-18 NOTE — Anesthesia Postprocedure Evaluation (Signed)
Anesthesia Post Note  Patient: Debbie Moran  Procedure(s) Performed:  APPENDECTOMY LAPAROSCOPIC  Anesthesia type: General  Patient location: PACU  Post pain: Pain level controlled  Post assessment: Patient's Cardiovascular Status Stable  Last Vitals:  Filed Vitals:   12/18/11 2137  BP: 157/97  Pulse: 80  Temp:   Resp: 18    Post vital signs: Reviewed and stable  Level of consciousness: alert  Complications: No apparent anesthesia complications

## 2011-12-18 NOTE — Brief Op Note (Signed)
12/18/2011  9:00 PM  PATIENT:  Debbie Moran, 41 y.o., female, MRN: [id number]  PREOP DIAGNOSIS:  Acute Appendicitis  POSTOP DIAGNOSIS:   Acute suppurative appendicitis  PROCEDURE:   Procedure(s): APPENDECTOMY LAPAROSCOPIC  SURGEON:   Ovidio Kin, M.D.  ASSISTANT:   None  ANESTHESIA:   general  Alanda Amass, CRNA - CRNA Constance Goltz, MD - Anesthesiologist  General  EBL:  minimal  ml  BLOOD ADMINISTERED: none  DRAINS: none   LOCAL MEDICATIONS USED:   30 cc 1/4% marcaine  SPECIMEN:   appendix  COUNTS CORRECT:  YES  INDICATIONS FOR PROCEDURE:  SHARAY BELLISSIMO is a 41 y.o. (DOB: 02-08-1971) white female whose primary care physician is No primary provider on file. and comes for appendectomy   The indications and risks of the surgery were explained to the patient.  The risks include, but are not limited to, infection, bleeding, open surgery, and nerve injury.  Note dictated to:   #[id number]  DN  12/18/2011

## 2011-12-18 NOTE — Transfer of Care (Signed)
Immediate Anesthesia Transfer of Care Note  Patient: Debbie Moran  Procedure(s) Performed:  APPENDECTOMY LAPAROSCOPIC  Patient Location: PACU  Anesthesia Type: General  Level of Consciousness: awake  Airway & Oxygen Therapy: Patient Spontanous Breathing and Patient connected to nasal cannula oxygen  Post-op Assessment: Report given to PACU RN and Post -op Vital signs reviewed and stable  Post vital signs: Reviewed and stable  Complications: No apparent anesthesia complications

## 2011-12-18 NOTE — Anesthesia Preprocedure Evaluation (Addendum)
Anesthesia Evaluation  Patient identified by MRN, date of birth, ID band Patient awake    Reviewed: Allergy & Precautions, H&P , NPO status , Patient's Chart, lab work & pertinent test results, reviewed documented beta blocker date and time   Airway Mallampati: II TM Distance: >3 FB Neck ROM: full    Dental  (+) Teeth Intact and Dental Advisory Given   Pulmonary neg pulmonary ROS,          Cardiovascular neg cardio ROS     Neuro/Psych Negative Neurological ROS  Negative Psych ROS   GI/Hepatic negative GI ROS, Neg liver ROS,   Endo/Other  Hypothyroidism   Renal/GU negative Renal ROS  Genitourinary negative   Musculoskeletal   Abdominal   Peds  Hematology negative hematology ROS (+)   Anesthesia Other Findings See surgeon's H&P   Reproductive/Obstetrics negative OB ROS                          Anesthesia Physical Anesthesia Plan  ASA: II and Emergent  Anesthesia Plan: General   Post-op Pain Management:    Induction: Intravenous, Rapid sequence and Cricoid pressure planned  Airway Management Planned: Oral ETT  Additional Equipment:   Intra-op Plan:   Post-operative Plan: Extubation in OR  Informed Consent: I have reviewed the patients History and Physical, chart, labs and discussed the procedure including the risks, benefits and alternatives for the proposed anesthesia with the patient or authorized representative who has indicated his/her understanding and acceptance.     Plan Discussed with: CRNA and Surgeon  Anesthesia Plan Comments:         Anesthesia Quick Evaluation

## 2011-12-19 ENCOUNTER — Encounter (HOSPITAL_COMMUNITY): Payer: Self-pay | Admitting: Surgery

## 2011-12-19 DIAGNOSIS — K37 Unspecified appendicitis: Secondary | ICD-10-CM | POA: Diagnosis present

## 2011-12-19 MED ORDER — OXYCODONE-ACETAMINOPHEN 5-325 MG PO TABS: 1.0000 | ORAL_TABLET | ORAL | Status: AC | PRN

## 2011-12-19 MED ORDER — OXYCODONE-ACETAMINOPHEN 5-325 MG PO TABS
1.0000 | ORAL_TABLET | ORAL | Status: DC | PRN
  Administered 2011-12-19: 1 via ORAL
  Filled 2011-12-19: qty 1

## 2011-12-19 NOTE — Progress Notes (Signed)
1 Day Post-Op  Subjective: Doing well. Mild pain, just sore. No N/V and tolerating clears. Voiding well  Objective: Vital signs in last 24 hours: Temp:  [97.5 F (36.4 C)-98.7 F (37.1 C)] 97.8 F (36.6 C) (01/22 0535) Pulse Rate:  [73-107] 88  (01/22 0535) Resp:  [16-20] 18  (01/22 0535) BP: (108-171)/(50-97) 142/87 mmHg (01/22 0535) SpO2:  [95 %-100 %] 97 % (01/22 0535) FiO2 (%):  [2 %] 2 % (01/22 0205) Weight:  [76.658 kg (169 lb)] 76.658 kg (169 lb) (01/22 0100)    Intake/Output this shift:    Physical Exam: BP 142/87  Pulse 88  Temp(Src) 97.8 F (36.6 C) (Oral)  Resp 18  Ht 5\' 2"  (1.575 m)  Wt 76.658 kg (169 lb)  BMI 30.91 kg/m2  SpO2 97%  LMP 12/12/2011 Lungs: CTA without w/r/r Heart: Regular Abdomen: soft, ND, appropriately tender   Incisions all c/d/i without erythema or hematoma. Ext: No edema or tenderness   Labs: CBC  Basename 12/18/11 1729  WBC 19.4*  HGB 13.3  HCT 39.7  PLT 508*   BMET  Basename 12/18/11 1729  NA 134*  K 3.4*  CL 98  CO2 24  GLUCOSE 88  BUN 9  CREATININE 0.66  CALCIUM 9.4   LFT  Basename 12/18/11 1729  PROT 8.2  ALBUMIN 3.8  AST 23  ALT 26  ALKPHOS 128*  BILITOT 0.5  BILIDIR --  IBILI --  LIPASE --   PT/INR No results found for this basename: LABPROT:2,INR:2 in the last 72 hours ABG No results found for this basename: PHART:2,PCO2:2,PO2:2,HCO3:2 in the last 72 hours  Studies/Results: No results found.  Assessment: Principal Problem:  *Appendicitis   Procedure(s): APPENDECTOMY LAPAROSCOPIC  Plan: Advance diet DC home later today  LOS: 1 day    Debbie Moran 12/19/2011

## 2011-12-19 NOTE — Progress Notes (Signed)
DC home with husband. No verbal questions concerning DC instructions.

## 2011-12-19 NOTE — ED Provider Notes (Signed)
I saw and evaluated the patient, reviewed the resident's note and I agree with the findings and plan.  Toy Baker, MD 12/19/11 [phone]

## 2011-12-19 NOTE — Discharge Summary (Signed)
Agree with discharge summary.

## 2011-12-19 NOTE — Progress Notes (Signed)
As above.  Discharge today.

## 2011-12-19 NOTE — Discharge Summary (Signed)
Physician Discharge Summary  Patient ID: Debbie Moran MRN: [id number] DOB/AGE: January 23, 1971 41 y.o.  Admit date: 12/18/2011 Discharge date: 12/19/2011  Admission Diagnoses: Appendicitis Discharge Diagnoses:  Principal Problem:  *Appendicitis    Procedures: Procedure(s): APPENDECTOMY LAPAROSCOPIC  Discharged Condition: good  Hospital Course:Pt admitted 1/21 after evaluation by Dr. Ezzard Standing in ER finds evidence of appendicitis. She was taken to OR on 1/21 and underwent lap appy. No intra-op or post-op issues. She has good pain control, voiding well, and tolerating diet well prior to discharge. She is stable for discharge.  Consults: none   Discharge Exam: Blood pressure 142/87, pulse 88, temperature 97.8 F (36.6 C), temperature source Oral, resp. rate 18, height 5\' 2"  (1.575 m), weight 76.658 kg (169 lb), last menstrual period 12/12/2011, SpO2 97.00%. Lungs: CTA without w/r/r Heart: Regular Abdomen: soft, ND, appropriately tender   Incisions all c/d/i without erythema or hematoma. Ext: No edema or tenderness   Disposition: Home or Self Care  Discharge Orders    Future Orders Please Complete By Expires   Diet general      Increase activity slowly      May shower / Bathe      May walk up steps      No dressing needed      Call MD for:  redness, tenderness, or signs of infection (pain, swelling, redness, odor or green/yellow discharge around incision site)      Call MD for:  severe uncontrolled pain      Call MD for:  persistant nausea and vomiting      Call MD for:  temperature >100.4        Medication List  As of 12/19/2011  1:22 PM   TAKE these medications         aspirin EC 81 MG tablet   Take 81 mg by mouth daily.      levothyroxine 50 MCG tablet   Commonly known as: SYNTHROID, LEVOTHROID   Take 50 mcg by mouth daily.      oxyCODONE-acetaminophen 5-325 MG per tablet   Commonly known as: PERCOCET   Take 1-2 tablets by mouth every 4 (four) hours as needed.           Follow-up Information    Follow up with CCS,MD, MD on 01/02/2012. (DOW clinic 1:40pm)    Contact information:   [facility] [address] [place] [address] [phone]          Signed: Alyse Low 12/19/2011, 1:22 PM

## 2012-01-02 ENCOUNTER — Ambulatory Visit (INDEPENDENT_AMBULATORY_CARE_PROVIDER_SITE_OTHER): Payer: BC Managed Care – PPO | Admitting: Radiology

## 2012-01-02 ENCOUNTER — Encounter (INDEPENDENT_AMBULATORY_CARE_PROVIDER_SITE_OTHER): Payer: Self-pay | Admitting: Radiology

## 2012-01-02 VITALS — BP 128/78 | HR 92 | Temp 97.2°F | Resp 18 | Ht 62.0 in | Wt 172.4 lb

## 2012-01-02 DIAGNOSIS — Z9889 Other specified postprocedural states: Secondary | ICD-10-CM

## 2012-01-02 DIAGNOSIS — Z9049 Acquired absence of other specified parts of digestive tract: Secondary | ICD-10-CM

## 2012-01-02 NOTE — Progress Notes (Addendum)
Shaeley R Yates Dec 07, 1970 [id number] 01/02/2012   History of Present Illness: Debbie Moran is a  41 y.o. female who presents today status post lap appy (by Dr. Algis Downs. Newman) - 12/18/2011.  Pathology reveals appendicitis.  The patient is tolerating a regular diet, having normal bowel movements, has good pain control.  She  is back to most normal activities.   Physical Exam: Abd: soft, nontender, active bowel sounds, nondistended.  All incisions are well healed.  Impression: 1.  Acute appendicitis, s/p lap appy  Plan: She  is able to return to normal activities. She  may follow up on a prn basis.   Marianna Fuss

## 2012-04-10 ENCOUNTER — Other Ambulatory Visit: Payer: Self-pay | Admitting: Obstetrics and Gynecology

## 2012-04-15 ENCOUNTER — Other Ambulatory Visit: Payer: Self-pay | Admitting: Obstetrics and Gynecology

## 2012-04-15 DIAGNOSIS — R928 Other abnormal and inconclusive findings on diagnostic imaging of breast: Secondary | ICD-10-CM

## 2012-04-18 ENCOUNTER — Ambulatory Visit
Admission: RE | Admit: 2012-04-18 | Discharge: 2012-04-18 | Disposition: A | Discharge status: Home / Self Care | Payer: BC Managed Care – PPO | Source: Ambulatory Visit | Attending: Obstetrics and Gynecology | Admitting: Obstetrics and Gynecology

## 2012-04-18 DIAGNOSIS — R928 Other abnormal and inconclusive findings on diagnostic imaging of breast: Secondary | ICD-10-CM

## 2013-05-15 ENCOUNTER — Other Ambulatory Visit: Payer: Self-pay | Admitting: Obstetrics and Gynecology

## 2013-12-05 ENCOUNTER — Other Ambulatory Visit: Payer: Self-pay | Admitting: Neurosurgery

## 2013-12-05 DIAGNOSIS — M545 Low back pain, unspecified: Secondary | ICD-10-CM

## 2013-12-09 ENCOUNTER — Other Ambulatory Visit: Payer: Self-pay | Admitting: Neurosurgery

## 2013-12-09 ENCOUNTER — Ambulatory Visit
Admission: RE | Admit: 2013-12-09 | Discharge: 2013-12-09 | Disposition: A | Discharge status: Home / Self Care | Payer: BC Managed Care – PPO | Source: Ambulatory Visit | Attending: Neurosurgery | Admitting: Neurosurgery

## 2013-12-09 DIAGNOSIS — M545 Low back pain, unspecified: Secondary | ICD-10-CM

## 2013-12-09 MED ORDER — METHYLPREDNISOLONE ACETATE 40 MG/ML INJ SUSP (RADIOLOG
120.0000 mg | Freq: Once | INTRAMUSCULAR | Status: AC
  Administered 2013-12-09: 120 mg via EPIDURAL

## 2013-12-09 MED ORDER — IOHEXOL 180 MG/ML  SOLN
1.0000 mL | Freq: Once | INTRAMUSCULAR | Status: AC | PRN
  Administered 2013-12-09: 1 mL via EPIDURAL

## 2013-12-09 NOTE — Discharge Instructions (Signed)

## 2014-02-04 ENCOUNTER — Other Ambulatory Visit: Payer: Self-pay | Admitting: Internal Medicine

## 2014-02-04 DIAGNOSIS — R42 Dizziness and giddiness: Secondary | ICD-10-CM

## 2014-02-09 ENCOUNTER — Ambulatory Visit
Admission: RE | Admit: 2014-02-09 | Discharge: 2014-02-09 | Disposition: A | Discharge status: Home / Self Care | Payer: BC Managed Care – PPO | Source: Ambulatory Visit | Attending: Internal Medicine | Admitting: Internal Medicine

## 2014-02-09 DIAGNOSIS — R42 Dizziness and giddiness: Secondary | ICD-10-CM

## 2015-06-21 ENCOUNTER — Other Ambulatory Visit: Payer: Self-pay | Admitting: Obstetrics and Gynecology

## 2015-06-21 DIAGNOSIS — R928 Other abnormal and inconclusive findings on diagnostic imaging of breast: Secondary | ICD-10-CM

## 2015-06-24 ENCOUNTER — Ambulatory Visit
Admission: RE | Admit: 2015-06-24 | Discharge: 2015-06-24 | Disposition: A | Discharge status: Home / Self Care | Payer: BLUE CROSS/BLUE SHIELD | Source: Ambulatory Visit | Attending: Obstetrics and Gynecology | Admitting: Obstetrics and Gynecology

## 2015-06-24 DIAGNOSIS — R928 Other abnormal and inconclusive findings on diagnostic imaging of breast: Secondary | ICD-10-CM

## 2016-08-01 DIAGNOSIS — I10 Essential (primary) hypertension: Secondary | ICD-10-CM | POA: Diagnosis not present

## 2016-08-01 DIAGNOSIS — E039 Hypothyroidism, unspecified: Secondary | ICD-10-CM | POA: Diagnosis not present

## 2016-08-08 DIAGNOSIS — Z0000 Encounter for general adult medical examination without abnormal findings: Secondary | ICD-10-CM | POA: Diagnosis not present

## 2016-08-08 DIAGNOSIS — I10 Essential (primary) hypertension: Secondary | ICD-10-CM | POA: Diagnosis not present

## 2016-08-08 DIAGNOSIS — D473 Essential (hemorrhagic) thrombocythemia: Secondary | ICD-10-CM | POA: Diagnosis not present

## 2016-08-08 DIAGNOSIS — E782 Mixed hyperlipidemia: Secondary | ICD-10-CM | POA: Diagnosis not present

## 2016-09-15 DIAGNOSIS — Z01419 Encounter for gynecological examination (general) (routine) without abnormal findings: Secondary | ICD-10-CM | POA: Diagnosis not present

## 2016-09-15 DIAGNOSIS — Z6831 Body mass index (BMI) 31.0-31.9, adult: Secondary | ICD-10-CM | POA: Diagnosis not present

## 2016-09-15 DIAGNOSIS — Z1231 Encounter for screening mammogram for malignant neoplasm of breast: Secondary | ICD-10-CM | POA: Diagnosis not present

## 2016-09-21 DIAGNOSIS — K210 Gastro-esophageal reflux disease with esophagitis: Secondary | ICD-10-CM | POA: Diagnosis not present

## 2016-09-21 DIAGNOSIS — R1310 Dysphagia, unspecified: Secondary | ICD-10-CM | POA: Diagnosis not present

## 2016-10-04 ENCOUNTER — Other Ambulatory Visit (HOSPITAL_COMMUNITY): Payer: Self-pay | Admitting: Internal Medicine

## 2016-10-04 DIAGNOSIS — R1310 Dysphagia, unspecified: Secondary | ICD-10-CM

## 2016-10-04 DIAGNOSIS — R1319 Other dysphagia: Secondary | ICD-10-CM

## 2016-10-16 ENCOUNTER — Other Ambulatory Visit (HOSPITAL_COMMUNITY): Payer: Self-pay | Admitting: Internal Medicine

## 2016-10-16 ENCOUNTER — Ambulatory Visit (HOSPITAL_COMMUNITY)
Admission: RE | Admit: 2016-10-16 | Discharge: 2016-10-16 | Disposition: A | Discharge status: Home / Self Care | Payer: BLUE CROSS/BLUE SHIELD | Source: Ambulatory Visit | Attending: Internal Medicine | Admitting: Internal Medicine

## 2016-10-16 DIAGNOSIS — R1310 Dysphagia, unspecified: Secondary | ICD-10-CM | POA: Insufficient documentation

## 2016-10-16 DIAGNOSIS — R1319 Other dysphagia: Secondary | ICD-10-CM

## 2016-10-16 DIAGNOSIS — K219 Gastro-esophageal reflux disease without esophagitis: Secondary | ICD-10-CM | POA: Diagnosis not present

## 2016-10-23 DIAGNOSIS — E6609 Other obesity due to excess calories: Secondary | ICD-10-CM | POA: Diagnosis not present

## 2016-10-23 DIAGNOSIS — H1045 Other chronic allergic conjunctivitis: Secondary | ICD-10-CM | POA: Diagnosis not present

## 2016-10-23 DIAGNOSIS — K210 Gastro-esophageal reflux disease with esophagitis: Secondary | ICD-10-CM | POA: Diagnosis not present

## 2016-10-23 DIAGNOSIS — K5901 Slow transit constipation: Secondary | ICD-10-CM | POA: Diagnosis not present

## 2016-11-28 DIAGNOSIS — E6609 Other obesity due to excess calories: Secondary | ICD-10-CM | POA: Diagnosis not present

## 2017-04-13 DIAGNOSIS — E041 Nontoxic single thyroid nodule: Secondary | ICD-10-CM | POA: Diagnosis not present

## 2017-04-17 ENCOUNTER — Other Ambulatory Visit: Payer: Self-pay | Admitting: Internal Medicine

## 2017-04-17 DIAGNOSIS — E041 Nontoxic single thyroid nodule: Secondary | ICD-10-CM

## 2017-05-04 ENCOUNTER — Other Ambulatory Visit: Payer: BLUE CROSS/BLUE SHIELD

## 2017-05-08 ENCOUNTER — Ambulatory Visit
Admission: RE | Admit: 2017-05-08 | Discharge: 2017-05-08 | Disposition: A | Discharge status: Home / Self Care | Payer: BLUE CROSS/BLUE SHIELD | Source: Ambulatory Visit | Attending: Internal Medicine | Admitting: Internal Medicine

## 2017-05-08 DIAGNOSIS — E049 Nontoxic goiter, unspecified: Secondary | ICD-10-CM | POA: Diagnosis not present

## 2017-05-08 DIAGNOSIS — E041 Nontoxic single thyroid nodule: Secondary | ICD-10-CM

## 2017-08-21 DIAGNOSIS — I10 Essential (primary) hypertension: Secondary | ICD-10-CM | POA: Diagnosis not present

## 2017-08-21 DIAGNOSIS — Z0000 Encounter for general adult medical examination without abnormal findings: Secondary | ICD-10-CM | POA: Diagnosis not present

## 2017-08-21 DIAGNOSIS — E039 Hypothyroidism, unspecified: Secondary | ICD-10-CM | POA: Diagnosis not present

## 2017-08-21 DIAGNOSIS — E782 Mixed hyperlipidemia: Secondary | ICD-10-CM | POA: Diagnosis not present

## 2017-08-24 DIAGNOSIS — D649 Anemia, unspecified: Secondary | ICD-10-CM | POA: Diagnosis not present

## 2017-08-28 DIAGNOSIS — Z0000 Encounter for general adult medical examination without abnormal findings: Secondary | ICD-10-CM | POA: Diagnosis not present

## 2017-08-28 DIAGNOSIS — M25552 Pain in left hip: Secondary | ICD-10-CM | POA: Diagnosis not present

## 2017-08-28 DIAGNOSIS — K210 Gastro-esophageal reflux disease with esophagitis: Secondary | ICD-10-CM | POA: Diagnosis not present

## 2017-08-28 DIAGNOSIS — D500 Iron deficiency anemia secondary to blood loss (chronic): Secondary | ICD-10-CM | POA: Diagnosis not present

## 2017-08-31 DIAGNOSIS — R140 Abdominal distension (gaseous): Secondary | ICD-10-CM | POA: Diagnosis not present

## 2017-08-31 DIAGNOSIS — D509 Iron deficiency anemia, unspecified: Secondary | ICD-10-CM | POA: Diagnosis not present

## 2017-09-10 DIAGNOSIS — Z1211 Encounter for screening for malignant neoplasm of colon: Secondary | ICD-10-CM | POA: Diagnosis not present

## 2017-09-10 DIAGNOSIS — D509 Iron deficiency anemia, unspecified: Secondary | ICD-10-CM | POA: Diagnosis not present

## 2017-09-10 DIAGNOSIS — K317 Polyp of stomach and duodenum: Secondary | ICD-10-CM | POA: Diagnosis not present

## 2017-09-10 DIAGNOSIS — K2930 Chronic superficial gastritis without bleeding: Secondary | ICD-10-CM | POA: Diagnosis not present

## 2017-09-10 DIAGNOSIS — R140 Abdominal distension (gaseous): Secondary | ICD-10-CM | POA: Diagnosis not present

## 2017-09-13 DIAGNOSIS — K2930 Chronic superficial gastritis without bleeding: Secondary | ICD-10-CM | POA: Diagnosis not present

## 2017-09-13 DIAGNOSIS — K317 Polyp of stomach and duodenum: Secondary | ICD-10-CM | POA: Diagnosis not present

## 2017-09-20 DIAGNOSIS — Z124 Encounter for screening for malignant neoplasm of cervix: Secondary | ICD-10-CM | POA: Diagnosis not present

## 2017-09-20 DIAGNOSIS — Z6831 Body mass index (BMI) 31.0-31.9, adult: Secondary | ICD-10-CM | POA: Diagnosis not present

## 2017-09-20 DIAGNOSIS — Z1231 Encounter for screening mammogram for malignant neoplasm of breast: Secondary | ICD-10-CM | POA: Diagnosis not present

## 2017-09-20 DIAGNOSIS — Z01419 Encounter for gynecological examination (general) (routine) without abnormal findings: Secondary | ICD-10-CM | POA: Diagnosis not present

## 2017-10-02 DIAGNOSIS — D500 Iron deficiency anemia secondary to blood loss (chronic): Secondary | ICD-10-CM | POA: Diagnosis not present

## 2017-10-02 DIAGNOSIS — M25552 Pain in left hip: Secondary | ICD-10-CM | POA: Diagnosis not present

## 2017-10-05 IMAGING — US US THYROID
1 series · 14 of 25 positions shown · non-contrast
Comparison: None.

CLINICAL DATA: Nodule seen on lifeline screening. History of
hypothyroidism. Patient is on thyroid medication.

EXAM:
THYROID ULTRASOUND
TECHNIQUE: Ultrasound examination of the thyroid gland and adjacent soft
tissues was performed.

[Series 1: us thyroid · 0.07mm/px · 14 of 42 slices shown]
[im 1/42]
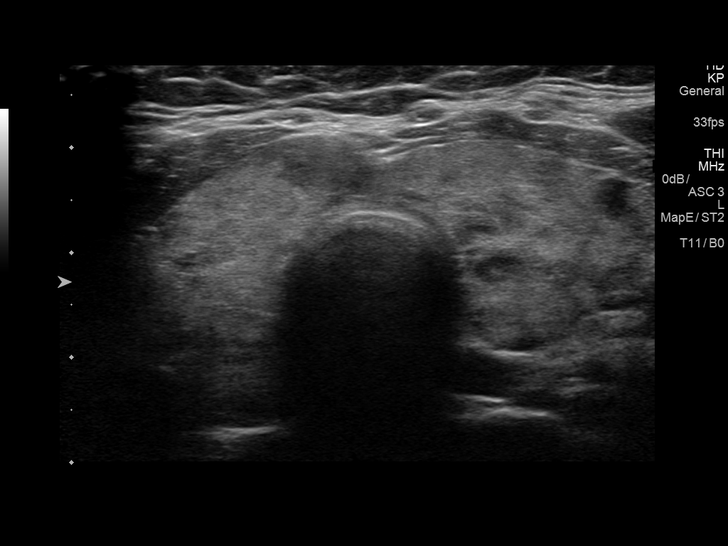
[im 4/42]
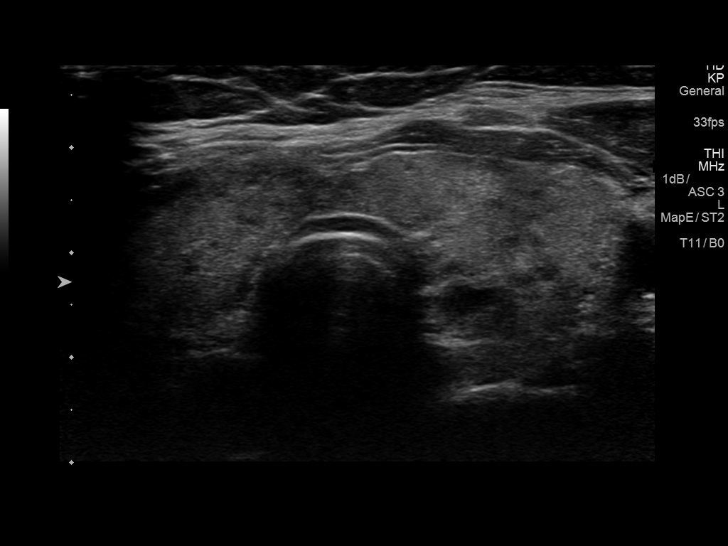
[im 7/42]
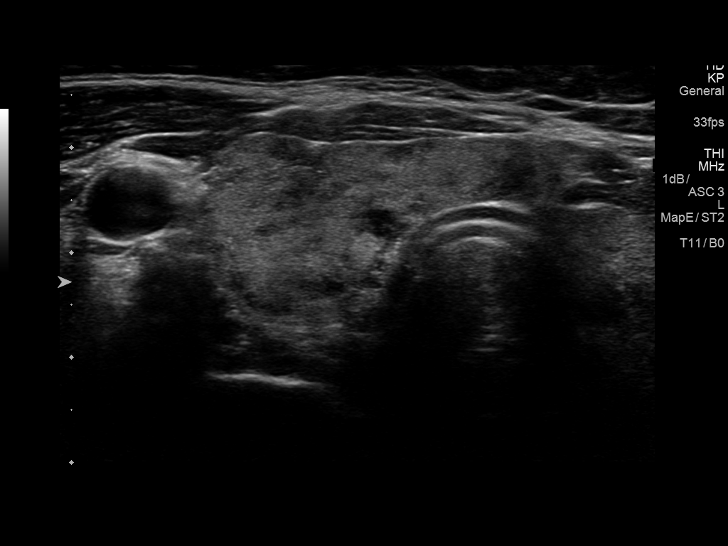
[im 11/42]
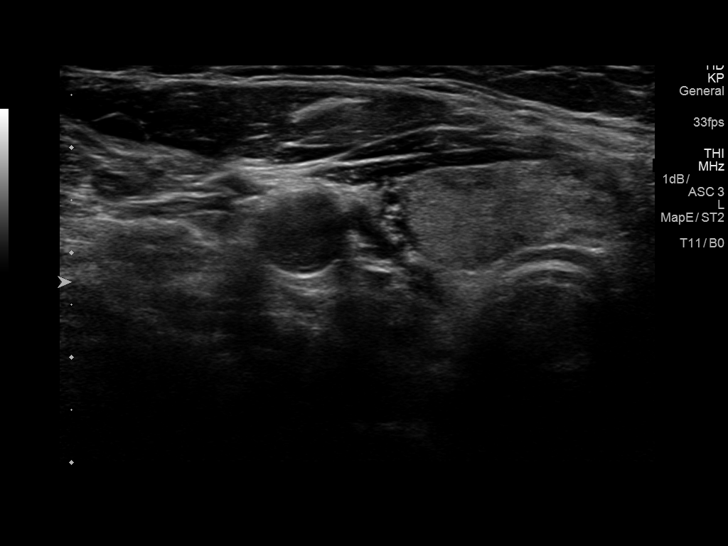
[im 14/42]
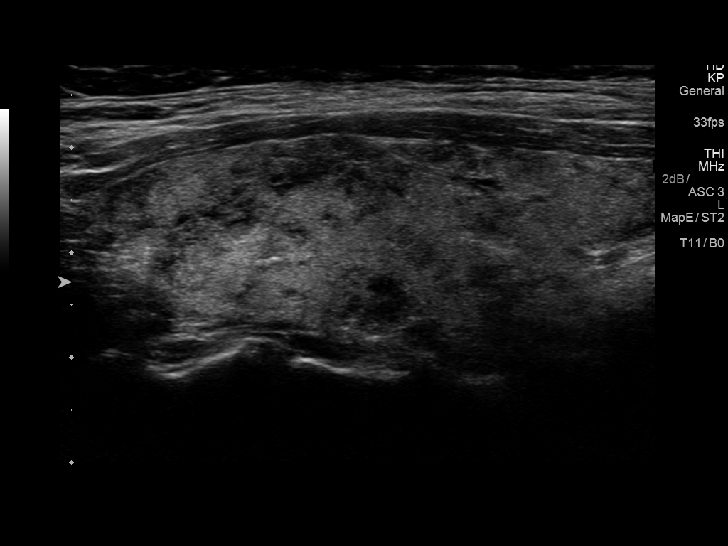
[im 16/42]
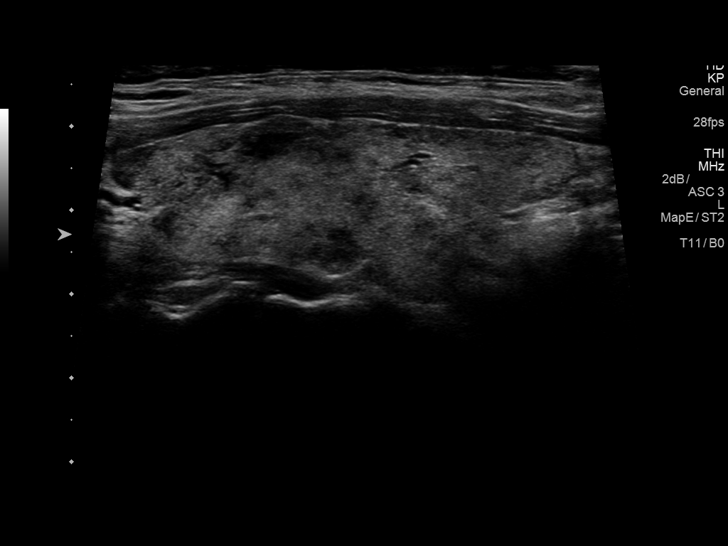
[im 19/42]
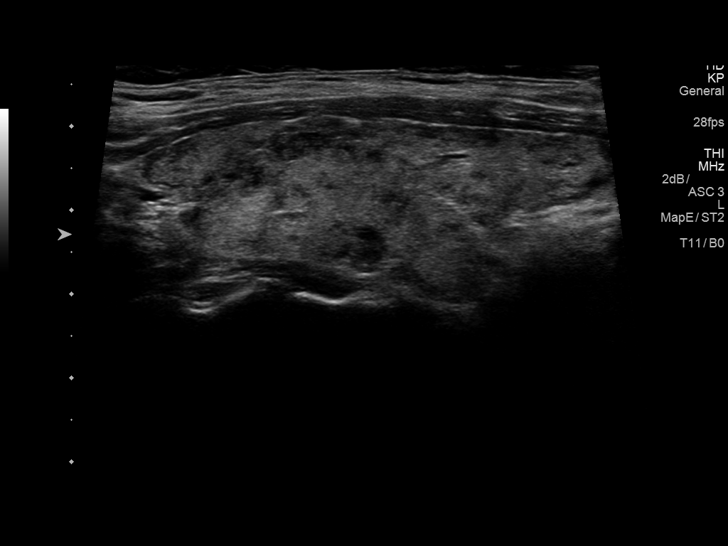
[im 23/42]
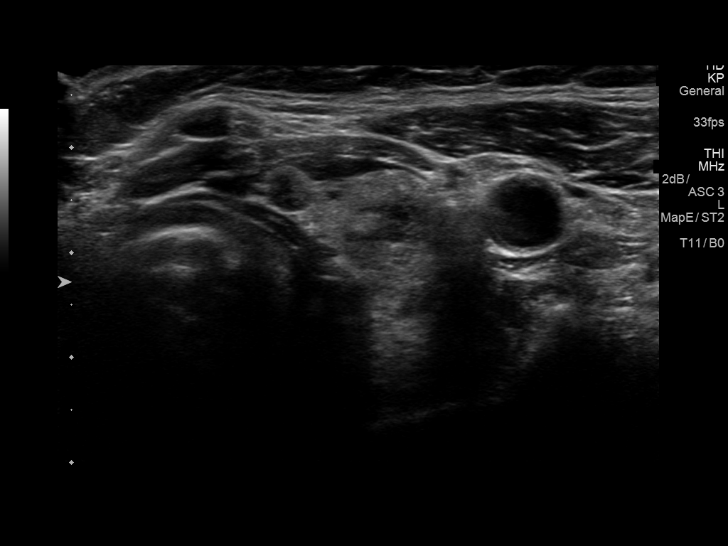
[im 26/42]
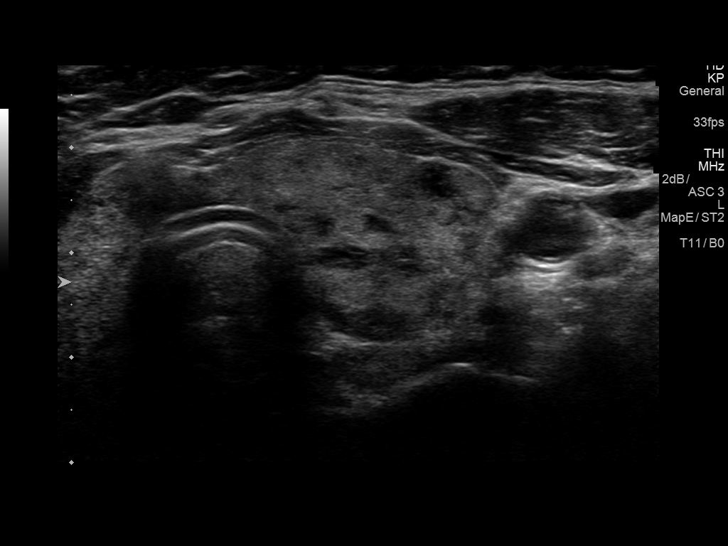
[im 28/42]
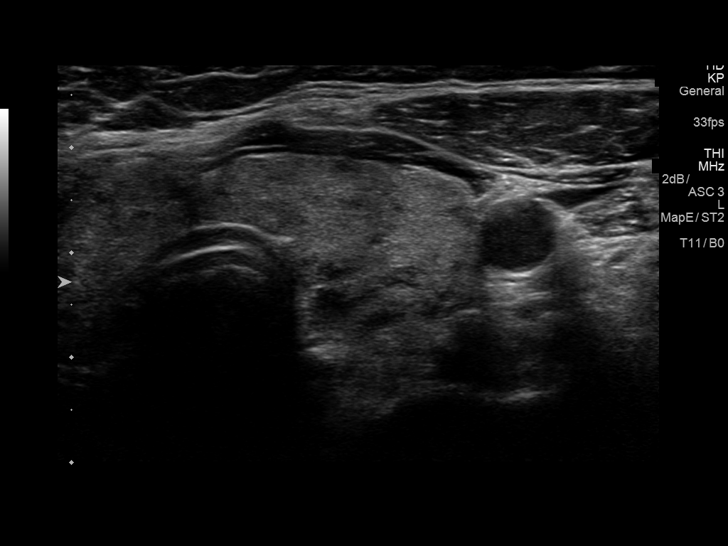
[im 31/42]
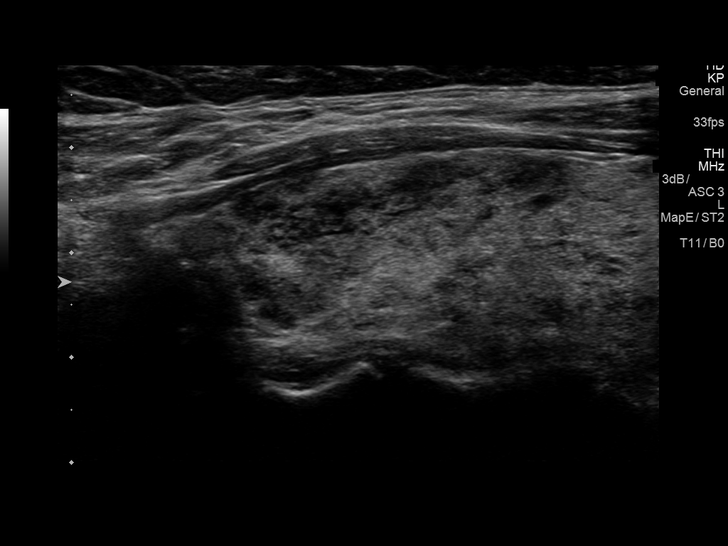
[im 35/42]
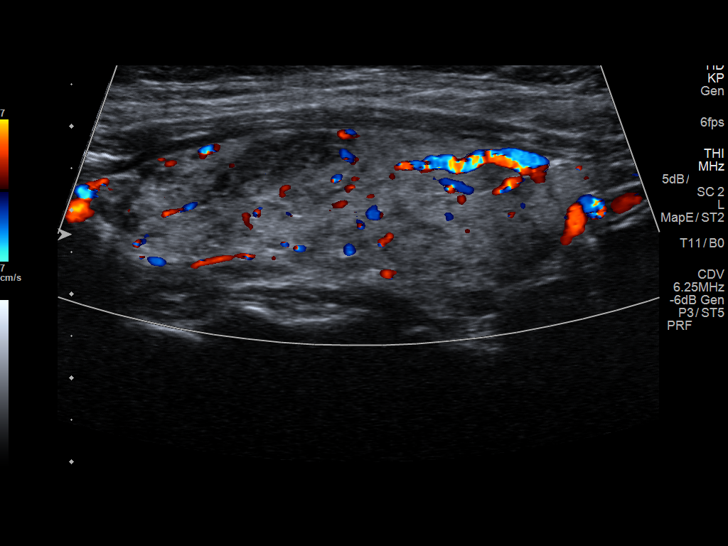
[im 38/42]
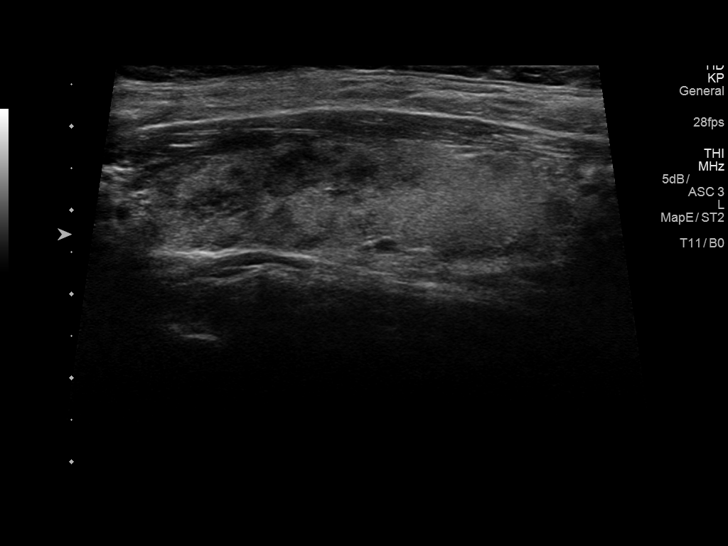
[im 42/42]
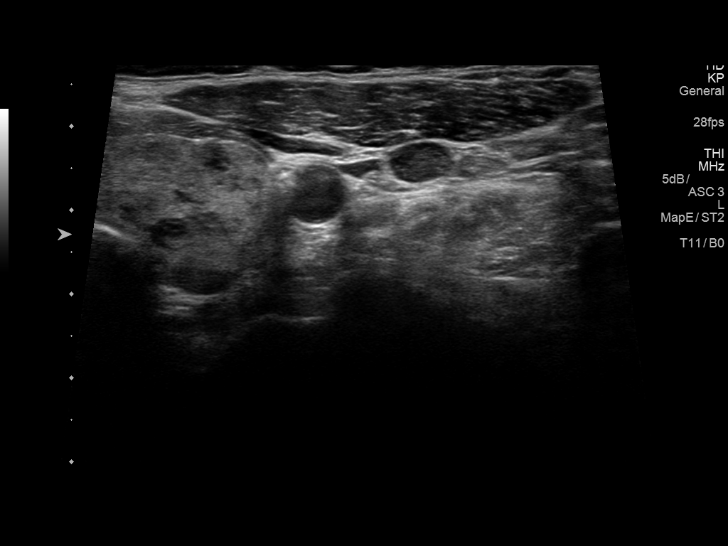

[14 of 25 positions shown; findings below may reference images not displayed]

FINDINGS: Parenchymal Echotexture: Markedly heterogenous

Isthmus: 0.7 cm in the AP dimension

Right lobe: 5.7 x 2.1 x 2.3 cm

Left lobe: 5.8 x 2.2 x 2.1 cm

_________________________________________________________

Estimated total number of nodules >/= 1 cm: 0

Number of spongiform nodules >/=  2 cm not described below (TR1): 0

Number of mixed cystic and solid nodules >/= 1.5 cm not described
below (TR2): 0

_________________________________________________________

No discrete nodules are seen within the thyroid gland. Evidence for
small lymph nodes on both sides of the neck.
IMPRESSION: Thyroid tissue is enlarged and diffusely heterogeneous. No discrete
thyroid nodules.

## 2017-10-09 DIAGNOSIS — M25552 Pain in left hip: Secondary | ICD-10-CM | POA: Diagnosis not present

## 2017-10-09 DIAGNOSIS — D500 Iron deficiency anemia secondary to blood loss (chronic): Secondary | ICD-10-CM | POA: Diagnosis not present

## 2017-10-09 DIAGNOSIS — D473 Essential (hemorrhagic) thrombocythemia: Secondary | ICD-10-CM | POA: Diagnosis not present

## 2017-10-11 ENCOUNTER — Encounter: Payer: Self-pay | Admitting: Hematology and Oncology

## 2017-10-11 ENCOUNTER — Telehealth: Payer: Self-pay | Admitting: Hematology and Oncology

## 2017-10-11 NOTE — Telephone Encounter (Signed)
Appt has been scheduled for the pt to see Dr. Verdie Drown a on 11/21 at 11am. Address verified. Letter mailed to the pt and faxed to the referring.

## 2017-10-15 DIAGNOSIS — D500 Iron deficiency anemia secondary to blood loss (chronic): Secondary | ICD-10-CM | POA: Diagnosis not present

## 2017-10-17 ENCOUNTER — Ambulatory Visit (HOSPITAL_BASED_OUTPATIENT_CLINIC_OR_DEPARTMENT_OTHER): Payer: BLUE CROSS/BLUE SHIELD

## 2017-10-17 ENCOUNTER — Ambulatory Visit: Payer: BLUE CROSS/BLUE SHIELD | Admitting: Hematology and Oncology

## 2017-10-17 ENCOUNTER — Telehealth: Payer: Self-pay | Admitting: Hematology and Oncology

## 2017-10-17 VITALS — BP 126/94 | HR 99 | Temp 99.0°F | Resp 18 | Ht 62.0 in | Wt 170.9 lb

## 2017-10-17 DIAGNOSIS — D473 Essential (hemorrhagic) thrombocythemia: Secondary | ICD-10-CM

## 2017-10-17 DIAGNOSIS — D500 Iron deficiency anemia secondary to blood loss (chronic): Secondary | ICD-10-CM

## 2017-10-17 DIAGNOSIS — D75838 Other thrombocytosis: Secondary | ICD-10-CM | POA: Insufficient documentation

## 2017-10-17 DIAGNOSIS — R7989 Other specified abnormal findings of blood chemistry: Secondary | ICD-10-CM | POA: Insufficient documentation

## 2017-10-17 NOTE — Telephone Encounter (Signed)
Scheduled appt per 11/21 los. Patient did not want avs or calendar.

## 2017-10-17 NOTE — Assessment & Plan Note (Signed)
Suspicious to be related to upper GI bleeding  pain as well as heavy menstrual bleeding: GI evaluation ongoing Currently on oral iron therapy 10/02/2017:  Hemoglobin 10.9 (improved from 9.9 prior to taking oral iron) Platelets 663 MCV 81.1 Ferritin 11 (was 7 on 08/24/2017), iron saturation 8% (was 9%) Reticulocyte count 1.8%  Discussion: Although patient responded to oral iron therapy, she is still profoundly iron deficient and could benefit from systemic IV iron therapy.  I recommended 2 doses of Feraheme 1 week apart to replenish her iron stores.  If her GI workup reveals a cause of the bleeding and it can be stopped then she may not need any further IV iron in the future.  However if the cause of the iron deficiency was not found or it was suspected to be malabsorption then she may need periodic IV iron infusions.  Return to clinic in 3 months with recheck of her labs and follow-up afterwards.

## 2017-10-17 NOTE — Assessment & Plan Note (Addendum)
Differential diagnosis: 1. Reactive thrombocytosis 2. Essential thrombocytosis 3. Iron deficiency anemia: Patient received IV iron therapy last week.  I suspect that the patient chronically has a platelet count of 500s and because of the recent iron deficiency had gone up into the 600s.  I explained to her the pathophysiology of essential thrombocytosis. Prognosis of essential thrombocytosis depends on risk of blood clots as well as age. Age greater than 2 and history of blood clots would be considered high risk. It is interesting to note that patients with essential thrombocytosis are equally at risk for bleeding as they are for clotting mainly because of ineffective platelet function. So I informed the patient to be careful about both bleeding and clotting issues.  Treatment plan: 1.  Continue aspirin. 2. Platelet lowering therapy is indicated only if she was determined to be high risk. Given her young age and lack of history of blood clots, I would like to watch her platelet count and monitor it every 3 months. If his platelet counts rapidly rise, platelet lowering therapy with anagrelide might be started.

## 2017-10-17 NOTE — Progress Notes (Signed)
[place] CONSULT NOTE  Patient Care Team: Merrilee Seashore, MD as PCP - General (Internal Medicine)  CHIEF COMPLAINTS/PURPOSE OF CONSULTATION:  Elevated platelet count  HISTORY OF PRESENTING ILLNESS:  Debbie Moran 46 y.o. female is here because of long-standing history of elevated platelet count.  Patient tells me that she was diagnosed with this problem when she was 46 years old.  She was noted to have a platelet count in the 500s.  She saw a hematologist who suggested that she may have essential thrombocytosis and instructed her to take aspirin.  The past 20 years her platelet counts have been in the 500s.  More recently she was diagnosed with iron deficiency anemia and simultaneously a workup revealed elevation of platelet count in the 600s.  Because of this she was referred to [place] for further evaluation.  It appears that for the iron deficiency she was initially placed on oral iron therapy and in a month and half it did not improve so she was given intravenous iron therapy.  It appears that she had tolerated the IV iron extremely well.  She was referred to gastroenterology who performed a workup including biopsies that ruled out celiac disease.  I reviewed her records extensively and collaborated the history with the patient.  MEDICAL HISTORY:  Past Medical History:  Diagnosis Date  . High platelet count   . Thyroid disease    hypothyroidism    SURGICAL HISTORY: Past Surgical History:  Procedure Laterality Date  . BACK SURGERY    . CESAREAN SECTION    . LAPAROSCOPIC APPENDECTOMY  12/18/2011   Procedure: APPENDECTOMY LAPAROSCOPIC;  Surgeon: Shann Medal, MD;  Location: [facility];  Service: General;  Laterality: N/A;    SOCIAL HISTORY: Social History   Socioeconomic History  . Marital status: Married    Spouse name: Not on file  . Number of children: Not on file  . Years of education: Not on file  . Highest education level: Not on file  Social Needs  .  Financial resource strain: Not on file  . Food insecurity - worry: Not on file  . Food insecurity - inability: Not on file  . Transportation needs - medical: Not on file  . Transportation needs - non-medical: Not on file  Occupational History  . Not on file  Tobacco Use  . Smoking status: Never Smoker  Substance and Sexual Activity  . Alcohol use: No  . Drug use: No  . Sexual activity: Yes    Birth control/protection: None  Other Topics Concern  . Not on file  Social History Narrative  . Not on file    FAMILY HISTORY: Family History  Problem Relation Age of Onset  . Cancer Mother        breast    ALLERGIES:  has No Known Allergies.  MEDICATIONS:  Current Outpatient Medications  Medication Sig Dispense Refill  . aspirin EC 81 MG tablet Take 81 mg by mouth daily.    Marland Kitchen levothyroxine (SYNTHROID, LEVOTHROID) 50 MCG tablet Take 50 mcg by mouth daily.     No current facility-administered medications for this visit.     REVIEW OF SYSTEMS:   Constitutional: Denies fevers, chills or abnormal night sweats Eyes: Denies blurriness of vision, double vision or watery eyes Ears, nose, mouth, throat, and face: Denies mucositis or sore throat Respiratory: Denies cough, dyspnea or wheezes Cardiovascular: Denies palpitation, chest discomfort or lower extremity swelling Gastrointestinal:  Denies nausea, heartburn or change in bowel habits  Skin: Denies abnormal skin rashes Lymphatics: Denies new lymphadenopathy or easy bruising Neurological:Denies numbness, tingling or new weaknesses Behavioral/Psych: Mood is stable, no new changes   All other systems were reviewed with the patient and are negative.  PHYSICAL EXAMINATION: ECOG PERFORMANCE STATUS: 1 - Symptomatic but completely ambulatory  Vitals:   10/17/17 1313  BP: (!) 126/94  Pulse: 99  Resp: 18  Temp: 99 F (37.2 C)  SpO2: 98%   Filed Weights   10/17/17 1313  Weight: 170 lb 14.4 oz (77.5 kg)    GENERAL:alert, no  distress and comfortable SKIN: skin color, texture, turgor are normal, no rashes or significant lesions EYES: normal, conjunctiva are pink and non-injected, sclera clear OROPHARYNX:no exudate, no erythema and lips, buccal mucosa, and tongue normal  NECK: supple, thyroid normal size, non-tender, without nodularity LYMPH:  no palpable lymphadenopathy in the cervical, axillary or inguinal LUNGS: clear to auscultation and percussion with normal breathing effort HEART: regular rate & rhythm and no murmurs and no lower extremity edema ABDOMEN:abdomen soft, non-tender and normal bowel sounds Musculoskeletal:no cyanosis of digits and no clubbing  PSYCH: alert & oriented x 3 with fluent speech NEURO: no focal motor/sensory deficits   LABORATORY DATA:  I have reviewed the data as listed Lab Results  Component Value Date   WBC 19.4 (H) 12/18/2011   HGB 13.3 12/18/2011   HCT 39.7 12/18/2011   MCV 84.3 12/18/2011   PLT 508 (H) 12/18/2011   Lab Results  Component Value Date   NA 134 (L) 12/18/2011   K 3.4 (L) 12/18/2011   CL 98 12/18/2011   CO2 24 12/18/2011    RADIOGRAPHIC STUDIES: I have personally reviewed the radiological reports and agreed with the findings in the report.  ASSESSMENT AND PLAN:  Iron deficiency anemia due to chronic blood loss Suspicious to be related to upper GI bleeding  pain as well as heavy menstrual bleeding: GI evaluation ongoing Currently on oral iron therapy 10/02/2017:  Hemoglobin 10.9 (improved from 9.9 prior to taking oral iron) Platelets 663 MCV 81.1 Ferritin 11 (was 7 on 08/24/2017), iron saturation 8% (was 9%) Reticulocyte count 1.8%  Patient received IV iron therapy. Return to clinic in after her visit with her primary care physician on December 11, 2017.   Essential thrombocytosis (HCC) Differential diagnosis: 1. Reactive thrombocytosis 2. Essential thrombocytosis 3. Iron deficiency anemia: Patient received IV iron therapy last week.  I suspect  that the patient chronically has a platelet count of 500s and because of the recent iron deficiency had gone up into the 600s.  I explained to her the pathophysiology of essential thrombocytosis. Prognosis of essential thrombocytosis depends on risk of blood clots as well as age. Age greater than 79 and history of blood clots would be considered high risk. It is interesting to note that patients with essential thrombocytosis are equally at risk for bleeding as they are for clotting mainly because of ineffective platelet function. So I informed the patient to be careful about both bleeding and clotting issues.  Treatment plan: 1.  Continue aspirin. 2. Platelet lowering therapy is indicated only if she was determined to be high risk. Given her young age and lack of history of blood clots, I would like to watch her platelet count and monitor it every 3 months. If his platelet counts rapidly rise, platelet lowering therapy with anagrelide might be started.  Follow-up December 11, 2017 after her primary care visit so that we can review the blood work.   All  questions were answered. The patient knows to call the clinic with any problems, questions or concerns.    Rulon Eisenmenger, MD 10/17/17

## 2017-10-22 DIAGNOSIS — D509 Iron deficiency anemia, unspecified: Secondary | ICD-10-CM | POA: Diagnosis not present

## 2017-10-24 DIAGNOSIS — D500 Iron deficiency anemia secondary to blood loss (chronic): Secondary | ICD-10-CM | POA: Diagnosis not present

## 2017-10-31 ENCOUNTER — Telehealth: Payer: Self-pay | Admitting: Hematology and Oncology

## 2017-10-31 NOTE — Telephone Encounter (Signed)
Pt cld to reschedule appt to see Dr. Lindi Adie on 12/13/17 at 145pm.

## 2017-12-04 DIAGNOSIS — D500 Iron deficiency anemia secondary to blood loss (chronic): Secondary | ICD-10-CM | POA: Diagnosis not present

## 2017-12-11 DIAGNOSIS — I10 Essential (primary) hypertension: Secondary | ICD-10-CM | POA: Diagnosis not present

## 2017-12-11 DIAGNOSIS — D473 Essential (hemorrhagic) thrombocythemia: Secondary | ICD-10-CM | POA: Diagnosis not present

## 2017-12-11 DIAGNOSIS — E039 Hypothyroidism, unspecified: Secondary | ICD-10-CM | POA: Diagnosis not present

## 2017-12-11 DIAGNOSIS — E782 Mixed hyperlipidemia: Secondary | ICD-10-CM | POA: Diagnosis not present

## 2017-12-12 ENCOUNTER — Ambulatory Visit: Payer: BLUE CROSS/BLUE SHIELD | Admitting: Hematology and Oncology

## 2017-12-12 NOTE — Assessment & Plan Note (Signed)
Suspicious to be related to upper GI bleeding  pain as well as heavy menstrual bleeding: GI evaluation ongoing Currently on oral iron therapy 10/02/2017:  Hemoglobin 10.9 (improved from 9.9 prior to taking oral iron) Platelets 663 MCV 81.1 Ferritin 11 (was 7 on 08/24/2017), iron saturation 8% (was 9%) Reticulocyte count 1.8%

## 2017-12-12 NOTE — Assessment & Plan Note (Signed)
Myeloproliferative neoplasm FISH panel: Normal.  No evidence of gene defects including Jak 2 mutation Most likely cause of elevated platelet count is reactive thrombocytosis from iron deficiency anemia Plan: Continue aspirin and treat underlying cause. Return to clinic in 3 months for follow-up

## 2017-12-13 ENCOUNTER — Telehealth: Payer: Self-pay | Admitting: Hematology and Oncology

## 2017-12-13 ENCOUNTER — Inpatient Hospital Stay: Payer: BLUE CROSS/BLUE SHIELD | Attending: Hematology and Oncology | Admitting: Hematology and Oncology

## 2017-12-13 VITALS — BP 139/99 | HR 93 | Temp 98.4°F | Resp 18 | Ht 62.0 in | Wt 174.0 lb

## 2017-12-13 DIAGNOSIS — D509 Iron deficiency anemia, unspecified: Secondary | ICD-10-CM | POA: Diagnosis not present

## 2017-12-13 DIAGNOSIS — D473 Essential (hemorrhagic) thrombocythemia: Secondary | ICD-10-CM | POA: Insufficient documentation

## 2017-12-13 DIAGNOSIS — D500 Iron deficiency anemia secondary to blood loss (chronic): Secondary | ICD-10-CM

## 2017-12-13 NOTE — Progress Notes (Signed)
Patient Care Team: Merrilee Seashore, MD as PCP - General (Internal Medicine)  DIAGNOSIS:  Encounter Diagnoses  Name Primary?  . Essential thrombocytosis ([place])   . Iron deficiency anemia due to chronic blood loss     CHIEF COMPLIANT: Follow-up of thrombocytosis and iron deficiency anemia  INTERVAL HISTORY: Debbie Moran is a 47 year old with above-mentioned history of thrombocytosis with extensive workup and is here today to discuss the results.  She was also noted to have iron deficiency anemia due to chronic blood loss.  REVIEW OF SYSTEMS:   Constitutional: Denies fevers, chills or abnormal weight loss Eyes: Denies blurriness of vision Ears, nose, mouth, throat, and face: Denies mucositis or sore throat Respiratory: Denies cough, dyspnea or wheezes Cardiovascular: Denies palpitation, chest discomfort Gastrointestinal:  Denies nausea, heartburn or change in bowel habits Skin: Denies abnormal skin rashes Lymphatics: Denies new lymphadenopathy or easy bruising Neurological:Denies numbness, tingling or new weaknesses Behavioral/Psych: Mood is stable, no new changes  Extremities: No lower extremity edema  All other systems were reviewed with the patient and are negative.  I have reviewed the past medical history, past surgical history, social history and family history with the patient and they are unchanged from previous note.  ALLERGIES:  has No Known Allergies.  MEDICATIONS:  Current Outpatient Medications  Medication Sig Dispense Refill  . aspirin EC 81 MG tablet Take 81 mg by mouth daily.    Marland Kitchen levothyroxine (SYNTHROID, LEVOTHROID) 50 MCG tablet Take 50 mcg by mouth daily.     No current facility-administered medications for this visit.     PHYSICAL EXAMINATION: ECOG PERFORMANCE STATUS: 1 - Symptomatic but completely ambulatory  There were no vitals filed for this visit. There were no vitals filed for this visit.  GENERAL:alert, no distress and  comfortable SKIN: skin color, texture, turgor are normal, no rashes or significant lesions EYES: normal, Conjunctiva are pink and non-injected, sclera clear OROPHARYNX:no exudate, no erythema and lips, buccal mucosa, and tongue normal  NECK: supple, thyroid normal size, non-tender, without nodularity LYMPH:  no palpable lymphadenopathy in the cervical, axillary or inguinal LUNGS: clear to auscultation and percussion with normal breathing effort HEART: regular rate & rhythm and no murmurs and no lower extremity edema ABDOMEN:abdomen soft, non-tender and normal bowel sounds MUSCULOSKELETAL:no cyanosis of digits and no clubbing  NEURO: alert & oriented x 3 with fluent speech, no focal motor/sensory deficits EXTREMITIES: No lower extremity edema   LABORATORY DATA:  I have reviewed the data as listed CMP Latest Ref Rng & Units 12/18/2011  Glucose 70 - 99 mg/dL 88  BUN 6 - 23 mg/dL 9  Creatinine 0.50 - 1.10 mg/dL 0.66  Sodium 135 - 145 mEq/L 134(L)  Potassium 3.5 - 5.1 mEq/L 3.4(L)  Chloride 96 - 112 mEq/L 98  CO2 19 - 32 mEq/L 24  Calcium 8.4 - 10.5 mg/dL 9.4  Total Protein 6.0 - 8.3 g/dL 8.2  Total Bilirubin 0.3 - 1.2 mg/dL 0.5  Alkaline Phos 39 - 117 U/L 128(H)  AST 0 - 37 U/L 23  ALT 0 - 35 U/L 26    Lab Results  Component Value Date   WBC 19.4 (H) 12/18/2011   HGB 13.3 12/18/2011   HCT 39.7 12/18/2011   MCV 84.3 12/18/2011   PLT 508 (H) 12/18/2011    ASSESSMENT & PLAN:  Essential thrombocytosis (HCC) Myeloproliferative neoplasm FISH panel: Normal.  No evidence of gene defects including Jak 2 mutation Most likely cause of elevated platelet count is reactive thrombocytosis from  iron deficiency anemia   Iron deficiency anemia due to chronic blood loss Suspicious to be related to upper GI bleeding  pain as well as heavy menstrual bleeding: Received IV iron therapy November 2018 at [facility] 12/04/2017: Hemoglobin 12.4 up from 10.9 Platelet count improved from 663  12/31/1988 Ferritin 323 which is up from 11 (was 7 on 08/24/2017), iron saturation 27% which was 8% (was 9%)  Return to clinic in 6 months with labs ahead of time and follow-up.  I spent 25 minutes talking to the patient of which more than half was spent in counseling and coordination of care.  No orders of the defined types were placed in this encounter.  The patient has a good understanding of the overall plan. she agrees with it. she will call with any problems that may develop before the next visit here.   Harriette Ohara, MD 12/13/17

## 2017-12-13 NOTE — Telephone Encounter (Signed)
Gave patient avs and calendar with appts per 1/17 los.  °

## 2018-02-07 DIAGNOSIS — D229 Melanocytic nevi, unspecified: Secondary | ICD-10-CM | POA: Diagnosis not present

## 2018-02-07 DIAGNOSIS — C44712 Basal cell carcinoma of skin of right lower limb, including hip: Secondary | ICD-10-CM | POA: Diagnosis not present

## 2018-02-07 DIAGNOSIS — L2089 Other atopic dermatitis: Secondary | ICD-10-CM | POA: Diagnosis not present

## 2018-02-07 DIAGNOSIS — D485 Neoplasm of uncertain behavior of skin: Secondary | ICD-10-CM | POA: Diagnosis not present

## 2018-02-07 DIAGNOSIS — L578 Other skin changes due to chronic exposure to nonionizing radiation: Secondary | ICD-10-CM | POA: Diagnosis not present

## 2018-03-12 DIAGNOSIS — C44712 Basal cell carcinoma of skin of right lower limb, including hip: Secondary | ICD-10-CM | POA: Diagnosis not present

## 2018-03-21 DIAGNOSIS — E039 Hypothyroidism, unspecified: Secondary | ICD-10-CM | POA: Diagnosis not present

## 2018-03-21 DIAGNOSIS — E6609 Other obesity due to excess calories: Secondary | ICD-10-CM | POA: Diagnosis not present

## 2018-03-21 DIAGNOSIS — I10 Essential (primary) hypertension: Secondary | ICD-10-CM | POA: Diagnosis not present

## 2018-03-21 DIAGNOSIS — E782 Mixed hyperlipidemia: Secondary | ICD-10-CM | POA: Diagnosis not present

## 2018-03-21 DIAGNOSIS — R062 Wheezing: Secondary | ICD-10-CM | POA: Diagnosis not present

## 2018-06-10 ENCOUNTER — Inpatient Hospital Stay: Payer: BLUE CROSS/BLUE SHIELD | Attending: Hematology and Oncology

## 2018-06-10 DIAGNOSIS — D500 Iron deficiency anemia secondary to blood loss (chronic): Secondary | ICD-10-CM | POA: Insufficient documentation

## 2018-06-10 DIAGNOSIS — R7989 Other specified abnormal findings of blood chemistry: Secondary | ICD-10-CM | POA: Diagnosis not present

## 2018-06-10 LAB — CBC WITH DIFFERENTIAL (CANCER CENTER ONLY)
BASOS PCT: 1 %
Basophils Absolute: 0.1 10*3/uL (ref 0.0–0.1)
EOS PCT: 6 %
Eosinophils Absolute: 0.6 10*3/uL — ABNORMAL HIGH (ref 0.0–0.5)
HEMATOCRIT: 39.2 % (ref 34.8–46.6)
Hemoglobin: 13 g/dL (ref 11.6–15.9)
Lymphocytes Relative: 27 %
Lymphs Abs: 2.5 10*3/uL (ref 0.9–3.3)
MCH: 29.7 pg (ref 25.1–34.0)
MCHC: 33.2 g/dL (ref 31.5–36.0)
MCV: 89.7 fL (ref 79.5–101.0)
MONOS PCT: 7 %
Monocytes Absolute: 0.6 10*3/uL (ref 0.1–0.9)
NEUTROS ABS: 5.3 10*3/uL (ref 1.5–6.5)
Neutrophils Relative %: 59 %
PLATELETS: 498 10*3/uL — AB (ref 145–400)
RBC: 4.37 MIL/uL (ref 3.70–5.45)
RDW: 13.1 % (ref 11.2–14.5)
WBC Count: 9 10*3/uL (ref 3.9–10.3)

## 2018-06-11 ENCOUNTER — Telehealth: Payer: Self-pay | Admitting: Hematology and Oncology

## 2018-06-11 ENCOUNTER — Inpatient Hospital Stay (HOSPITAL_BASED_OUTPATIENT_CLINIC_OR_DEPARTMENT_OTHER): Payer: BLUE CROSS/BLUE SHIELD | Admitting: Hematology and Oncology

## 2018-06-11 DIAGNOSIS — R7989 Other specified abnormal findings of blood chemistry: Secondary | ICD-10-CM

## 2018-06-11 DIAGNOSIS — D75838 Other thrombocytosis: Secondary | ICD-10-CM

## 2018-06-11 DIAGNOSIS — D500 Iron deficiency anemia secondary to blood loss (chronic): Secondary | ICD-10-CM

## 2018-06-11 LAB — IRON AND TIBC
Iron: 64 ug/dL (ref 41–142)
Saturation Ratios: 32 % (ref 21–57)
TIBC: 201 ug/dL — ABNORMAL LOW (ref 236–444)
UIBC: 137 ug/dL

## 2018-06-11 LAB — FERRITIN: Ferritin: 156 ng/mL (ref 11–307)

## 2018-06-11 NOTE — Progress Notes (Signed)
Patient Care Team: Merrilee Seashore, MD as PCP - General (Internal Medicine)  DIAGNOSIS:  Encounter Diagnoses  Name Primary?  . Reactive thrombocytosis   . Iron deficiency anemia due to chronic blood loss     CHIEF COMPLIANT: Follow-up of thrombocytosis and iron deficiency  INTERVAL HISTORY: Debbie Moran is a 47 year old with above-mentioned history of thrombocytosis due to iron deficiency who received IV iron therapy and is no longer iron deficient.  She is here for six-month checkup of her blood counts.  She reports no major health issues.  REVIEW OF SYSTEMS:   Constitutional: Denies fevers, chills or abnormal weight loss Eyes: Denies blurriness of vision Ears, nose, mouth, throat, and face: Denies mucositis or sore throat Respiratory: Denies cough, dyspnea or wheezes Cardiovascular: Denies palpitation, chest discomfort Gastrointestinal:  Denies nausea, heartburn or change in bowel habits Skin: Denies abnormal skin rashes Lymphatics: Denies new lymphadenopathy or easy bruising Neurological:Denies numbness, tingling or new weaknesses Behavioral/Psych: Mood is stable, no new changes  Extremities: No lower extremity edema Breast: denies any pain or lumps or nodules in either breasts All other systems were reviewed with the patient and are negative.  I have reviewed the past medical history, past surgical history, social history and family history with the patient and they are unchanged from previous note.  ALLERGIES:  has No Known Allergies.  MEDICATIONS:  Current Outpatient Medications  Medication Sig Dispense Refill  . aspirin EC 81 MG tablet Take 81 mg by mouth daily.    Marland Kitchen levothyroxine (SYNTHROID, LEVOTHROID) 50 MCG tablet Take 50 mcg by mouth daily.     No current facility-administered medications for this visit.     PHYSICAL EXAMINATION: ECOG PERFORMANCE STATUS: 0 - Asymptomatic  Vitals:   06/11/18 1531  BP: 136/86  Pulse: 96  Resp: 18  Temp: 99.2 F  (37.3 C)  SpO2: 100%   Filed Weights   06/11/18 1531  Weight: 174 lb 8 oz (79.2 kg)    GENERAL:alert, no distress and comfortable SKIN: skin color, texture, turgor are normal, no rashes or significant lesions EYES: normal, Conjunctiva are pink and non-injected, sclera clear OROPHARYNX:no exudate, no erythema and lips, buccal mucosa, and tongue normal  NECK: supple, thyroid normal size, non-tender, without nodularity LYMPH:  no palpable lymphadenopathy in the cervical, axillary or inguinal LUNGS: clear to auscultation and percussion with normal breathing effort HEART: regular rate & rhythm and no murmurs and no lower extremity edema ABDOMEN:abdomen soft, non-tender and normal bowel sounds MUSCULOSKELETAL:no cyanosis of digits and no clubbing  NEURO: alert & oriented x 3 with fluent speech, no focal motor/sensory deficits EXTREMITIES: No lower extremity edema  LABORATORY DATA:  I have reviewed the data as listed CMP Latest Ref Rng & Units 12/18/2011  Glucose 70 - 99 mg/dL 88  BUN 6 - 23 mg/dL 9  Creatinine 0.50 - 1.10 mg/dL 0.66  Sodium 135 - 145 mEq/L 134(L)  Potassium 3.5 - 5.1 mEq/L 3.4(L)  Chloride 96 - 112 mEq/L 98  CO2 19 - 32 mEq/L 24  Calcium 8.4 - 10.5 mg/dL 9.4  Total Protein 6.0 - 8.3 g/dL 8.2  Total Bilirubin 0.3 - 1.2 mg/dL 0.5  Alkaline Phos 39 - 117 U/L 128(H)  AST 0 - 37 U/L 23  ALT 0 - 35 U/L 26    Lab Results  Component Value Date   WBC 9.0 06/10/2018   HGB 13.0 06/10/2018   HCT 39.2 06/10/2018   MCV 89.7 06/10/2018   PLT 498 (H) 06/10/2018  NEUTROABS 5.3 06/10/2018    ASSESSMENT & PLAN:  Reactive thrombocytosis Myeloproliferative neoplasm FISH panel: Normal.  No evidence of gene defects including Jak 2 mutation Most likely cause of elevated platelet count is reactive thrombocytosis from iron deficiency anemia    Iron deficiency anemia due to chronic blood loss Lab review:Suspicious to be related to upper GI bleeding pain as well as heavy  menstrual bleeding: Received IV iron therapy November 2018 at [facility]  Lab review: 06/10/2018  Return to clinic in 1 year with labs done ahead of time.  If these are normal then we do not have to see the patient in the future.    No orders of the defined types were placed in this encounter.  The patient has a good understanding of the overall plan. she agrees with it. she will call with any problems that may develop before the next visit here.   Harriette Ohara, MD 06/11/18

## 2018-06-11 NOTE — Assessment & Plan Note (Signed)
Lab review:Suspicious to be related to upper GI bleeding pain as well as heavy menstrual bleeding: Received IV iron therapy November 2018 at [facility]  Lab review: 06/10/2018  Return to clinic in 6 months with labs done ahead of time

## 2018-06-11 NOTE — Assessment & Plan Note (Signed)
Myeloproliferative neoplasm FISH panel: Normal. No evidence of gene defects including Jak 2 mutation Most likely cause of elevated platelet count is reactive thrombocytosis from iron deficiency anemia 

## 2018-06-11 NOTE — Telephone Encounter (Signed)
Gave patient avs and calendar of upcoming July appts.  °

## 2018-07-26 DIAGNOSIS — M545 Low back pain: Secondary | ICD-10-CM | POA: Diagnosis not present

## 2018-08-08 DIAGNOSIS — M5440 Lumbago with sciatica, unspecified side: Secondary | ICD-10-CM | POA: Diagnosis not present

## 2018-08-19 DIAGNOSIS — M5440 Lumbago with sciatica, unspecified side: Secondary | ICD-10-CM | POA: Diagnosis not present

## 2018-08-27 DIAGNOSIS — M5440 Lumbago with sciatica, unspecified side: Secondary | ICD-10-CM | POA: Diagnosis not present

## 2018-08-27 DIAGNOSIS — M5126 Other intervertebral disc displacement, lumbar region: Secondary | ICD-10-CM | POA: Diagnosis not present

## 2018-09-03 DIAGNOSIS — M5440 Lumbago with sciatica, unspecified side: Secondary | ICD-10-CM | POA: Diagnosis not present

## 2018-09-10 DIAGNOSIS — E782 Mixed hyperlipidemia: Secondary | ICD-10-CM | POA: Diagnosis not present

## 2018-09-10 DIAGNOSIS — I10 Essential (primary) hypertension: Secondary | ICD-10-CM | POA: Diagnosis not present

## 2018-09-10 DIAGNOSIS — D500 Iron deficiency anemia secondary to blood loss (chronic): Secondary | ICD-10-CM | POA: Diagnosis not present

## 2018-09-10 DIAGNOSIS — E039 Hypothyroidism, unspecified: Secondary | ICD-10-CM | POA: Diagnosis not present

## 2018-09-17 DIAGNOSIS — E782 Mixed hyperlipidemia: Secondary | ICD-10-CM | POA: Diagnosis not present

## 2018-09-17 DIAGNOSIS — E039 Hypothyroidism, unspecified: Secondary | ICD-10-CM | POA: Diagnosis not present

## 2018-09-17 DIAGNOSIS — I10 Essential (primary) hypertension: Secondary | ICD-10-CM | POA: Diagnosis not present

## 2018-09-17 DIAGNOSIS — Z0000 Encounter for general adult medical examination without abnormal findings: Secondary | ICD-10-CM | POA: Diagnosis not present

## 2018-09-26 DIAGNOSIS — Z803 Family history of malignant neoplasm of breast: Secondary | ICD-10-CM | POA: Diagnosis not present

## 2018-09-26 DIAGNOSIS — Z1231 Encounter for screening mammogram for malignant neoplasm of breast: Secondary | ICD-10-CM | POA: Diagnosis not present

## 2018-09-26 DIAGNOSIS — Z801 Family history of malignant neoplasm of trachea, bronchus and lung: Secondary | ICD-10-CM | POA: Diagnosis not present

## 2018-09-26 DIAGNOSIS — Z800 Family history of malignant neoplasm of digestive organs: Secondary | ICD-10-CM | POA: Diagnosis not present

## 2018-09-26 DIAGNOSIS — Z6831 Body mass index (BMI) 31.0-31.9, adult: Secondary | ICD-10-CM | POA: Diagnosis not present

## 2018-09-26 DIAGNOSIS — Z01419 Encounter for gynecological examination (general) (routine) without abnormal findings: Secondary | ICD-10-CM | POA: Diagnosis not present

## 2018-09-26 DIAGNOSIS — Z808 Family history of malignant neoplasm of other organs or systems: Secondary | ICD-10-CM | POA: Diagnosis not present

## 2018-10-02 DIAGNOSIS — M5440 Lumbago with sciatica, unspecified side: Secondary | ICD-10-CM | POA: Diagnosis not present

## 2018-10-07 DIAGNOSIS — M5440 Lumbago with sciatica, unspecified side: Secondary | ICD-10-CM | POA: Diagnosis not present

## 2018-10-29 DIAGNOSIS — Z0000 Encounter for general adult medical examination without abnormal findings: Secondary | ICD-10-CM | POA: Diagnosis not present

## 2018-11-04 DIAGNOSIS — E782 Mixed hyperlipidemia: Secondary | ICD-10-CM | POA: Diagnosis not present

## 2018-11-04 DIAGNOSIS — E039 Hypothyroidism, unspecified: Secondary | ICD-10-CM | POA: Diagnosis not present

## 2018-11-04 DIAGNOSIS — I10 Essential (primary) hypertension: Secondary | ICD-10-CM | POA: Diagnosis not present

## 2018-12-28 DIAGNOSIS — M5416 Radiculopathy, lumbar region: Secondary | ICD-10-CM | POA: Diagnosis not present

## 2019-02-13 DIAGNOSIS — Z85828 Personal history of other malignant neoplasm of skin: Secondary | ICD-10-CM | POA: Diagnosis not present

## 2019-02-13 DIAGNOSIS — L578 Other skin changes due to chronic exposure to nonionizing radiation: Secondary | ICD-10-CM | POA: Diagnosis not present

## 2019-02-13 DIAGNOSIS — Z08 Encounter for follow-up examination after completed treatment for malignant neoplasm: Secondary | ICD-10-CM | POA: Diagnosis not present

## 2019-02-13 DIAGNOSIS — L905 Scar conditions and fibrosis of skin: Secondary | ICD-10-CM | POA: Diagnosis not present

## 2019-04-07 DIAGNOSIS — E782 Mixed hyperlipidemia: Secondary | ICD-10-CM | POA: Diagnosis not present

## 2019-04-07 DIAGNOSIS — I10 Essential (primary) hypertension: Secondary | ICD-10-CM | POA: Diagnosis not present

## 2019-04-15 DIAGNOSIS — E039 Hypothyroidism, unspecified: Secondary | ICD-10-CM | POA: Diagnosis not present

## 2019-04-15 DIAGNOSIS — E782 Mixed hyperlipidemia: Secondary | ICD-10-CM | POA: Diagnosis not present

## 2019-04-15 DIAGNOSIS — I10 Essential (primary) hypertension: Secondary | ICD-10-CM | POA: Diagnosis not present

## 2019-04-15 DIAGNOSIS — D473 Essential (hemorrhagic) thrombocythemia: Secondary | ICD-10-CM | POA: Diagnosis not present

## 2019-06-04 ENCOUNTER — Telehealth: Payer: Self-pay | Admitting: Hematology and Oncology

## 2019-06-04 NOTE — Telephone Encounter (Signed)
When I called patient regarding visit she said she wanted to cancel both visits

## 2019-06-04 NOTE — Assessment & Plan Note (Deleted)
Lab review:Suspicious to be related to upper GI bleeding pain as well as heavy menstrual bleeding: Received IV iron therapy November 2018 at [facility]  Lab review: 06/09/2019: Iron saturation 32%, hemoglobin 13, MCV 89.7, platelets 498, ferritin 156 Based on these excellent results she does not need any follow-ups with me on a routine basis. However if her iron studies show worsening then we can see her on an as-needed basis.

## 2019-06-04 NOTE — Assessment & Plan Note (Deleted)
Myeloproliferative neoplasm FISH panel: Normal. No evidence of gene defects including Jak 2 mutation Most likely cause of elevated platelet count is reactive thrombocytosis from iron deficiency anemia

## 2019-06-09 ENCOUNTER — Other Ambulatory Visit: Payer: BLUE CROSS/BLUE SHIELD

## 2019-06-10 ENCOUNTER — Ambulatory Visit: Payer: BLUE CROSS/BLUE SHIELD | Admitting: Hematology and Oncology

## 2019-10-07 DIAGNOSIS — Z0000 Encounter for general adult medical examination without abnormal findings: Secondary | ICD-10-CM | POA: Diagnosis not present

## 2019-10-07 DIAGNOSIS — I10 Essential (primary) hypertension: Secondary | ICD-10-CM | POA: Diagnosis not present

## 2019-10-07 DIAGNOSIS — E782 Mixed hyperlipidemia: Secondary | ICD-10-CM | POA: Diagnosis not present

## 2019-10-14 DIAGNOSIS — E039 Hypothyroidism, unspecified: Secondary | ICD-10-CM | POA: Diagnosis not present

## 2019-10-14 DIAGNOSIS — E6609 Other obesity due to excess calories: Secondary | ICD-10-CM | POA: Diagnosis not present

## 2019-10-14 DIAGNOSIS — R0683 Snoring: Secondary | ICD-10-CM | POA: Diagnosis not present

## 2019-10-14 DIAGNOSIS — E782 Mixed hyperlipidemia: Secondary | ICD-10-CM | POA: Diagnosis not present

## 2019-10-14 DIAGNOSIS — I10 Essential (primary) hypertension: Secondary | ICD-10-CM | POA: Diagnosis not present

## 2019-10-14 DIAGNOSIS — Z0000 Encounter for general adult medical examination without abnormal findings: Secondary | ICD-10-CM | POA: Diagnosis not present

## 2019-10-15 DIAGNOSIS — Z1231 Encounter for screening mammogram for malignant neoplasm of breast: Secondary | ICD-10-CM | POA: Diagnosis not present

## 2019-10-15 DIAGNOSIS — Z01419 Encounter for gynecological examination (general) (routine) without abnormal findings: Secondary | ICD-10-CM | POA: Diagnosis not present

## 2019-10-15 DIAGNOSIS — Z6832 Body mass index (BMI) 32.0-32.9, adult: Secondary | ICD-10-CM | POA: Diagnosis not present

## 2019-11-04 ENCOUNTER — Institutional Professional Consult (permissible substitution): Payer: Self-pay | Admitting: Neurology

## 2020-02-12 DIAGNOSIS — D220 Melanocytic nevi of lip: Secondary | ICD-10-CM | POA: Diagnosis not present

## 2020-02-12 DIAGNOSIS — L700 Acne vulgaris: Secondary | ICD-10-CM | POA: Diagnosis not present

## 2020-02-12 DIAGNOSIS — L578 Other skin changes due to chronic exposure to nonionizing radiation: Secondary | ICD-10-CM | POA: Diagnosis not present

## 2020-02-12 DIAGNOSIS — Z85828 Personal history of other malignant neoplasm of skin: Secondary | ICD-10-CM | POA: Diagnosis not present

## 2020-03-03 ENCOUNTER — Other Ambulatory Visit: Payer: Self-pay | Admitting: Internal Medicine

## 2020-03-03 DIAGNOSIS — R2232 Localized swelling, mass and lump, left upper limb: Secondary | ICD-10-CM

## 2020-03-03 DIAGNOSIS — R002 Palpitations: Secondary | ICD-10-CM | POA: Diagnosis not present

## 2020-03-16 ENCOUNTER — Ambulatory Visit
Admission: RE | Admit: 2020-03-16 | Discharge: 2020-03-16 | Disposition: A | Discharge status: Home / Self Care | Payer: BC Managed Care – PPO | Source: Ambulatory Visit | Attending: Internal Medicine | Admitting: Internal Medicine

## 2020-03-16 ENCOUNTER — Ambulatory Visit
Admission: RE | Admit: 2020-03-16 | Discharge: 2020-03-16 | Disposition: A | Discharge status: Home / Self Care | Payer: BLUE CROSS/BLUE SHIELD | Source: Ambulatory Visit | Attending: Internal Medicine | Admitting: Internal Medicine

## 2020-03-16 ENCOUNTER — Other Ambulatory Visit: Payer: Self-pay

## 2020-03-16 DIAGNOSIS — R922 Inconclusive mammogram: Secondary | ICD-10-CM | POA: Diagnosis not present

## 2020-03-16 DIAGNOSIS — R2232 Localized swelling, mass and lump, left upper limb: Secondary | ICD-10-CM

## 2020-03-16 DIAGNOSIS — N6320 Unspecified lump in the left breast, unspecified quadrant: Secondary | ICD-10-CM | POA: Diagnosis not present

## 2020-07-30 DIAGNOSIS — Z20828 Contact with and (suspected) exposure to other viral communicable diseases: Secondary | ICD-10-CM | POA: Diagnosis not present

## 2020-07-30 DIAGNOSIS — J3489 Other specified disorders of nose and nasal sinuses: Secondary | ICD-10-CM | POA: Diagnosis not present

## 2020-08-03 DIAGNOSIS — Z20822 Contact with and (suspected) exposure to covid-19: Secondary | ICD-10-CM | POA: Diagnosis not present

## 2020-08-03 DIAGNOSIS — Z20828 Contact with and (suspected) exposure to other viral communicable diseases: Secondary | ICD-10-CM | POA: Diagnosis not present

## 2020-08-03 DIAGNOSIS — U071 COVID-19: Secondary | ICD-10-CM | POA: Diagnosis not present

## 2020-08-05 DIAGNOSIS — U071 COVID-19: Secondary | ICD-10-CM | POA: Diagnosis not present

## 2020-09-09 DIAGNOSIS — K2100 Gastro-esophageal reflux disease with esophagitis, without bleeding: Secondary | ICD-10-CM | POA: Diagnosis not present

## 2020-09-09 DIAGNOSIS — R197 Diarrhea, unspecified: Secondary | ICD-10-CM | POA: Diagnosis not present

## 2020-09-09 DIAGNOSIS — R109 Unspecified abdominal pain: Secondary | ICD-10-CM | POA: Diagnosis not present

## 2020-09-09 DIAGNOSIS — R143 Flatulence: Secondary | ICD-10-CM | POA: Diagnosis not present

## 2020-09-14 DIAGNOSIS — K219 Gastro-esophageal reflux disease without esophagitis: Secondary | ICD-10-CM | POA: Diagnosis not present

## 2020-09-14 DIAGNOSIS — K5904 Chronic idiopathic constipation: Secondary | ICD-10-CM | POA: Diagnosis not present

## 2020-10-12 DIAGNOSIS — Z0000 Encounter for general adult medical examination without abnormal findings: Secondary | ICD-10-CM | POA: Diagnosis not present

## 2020-10-12 DIAGNOSIS — E782 Mixed hyperlipidemia: Secondary | ICD-10-CM | POA: Diagnosis not present

## 2020-10-26 DIAGNOSIS — Z0000 Encounter for general adult medical examination without abnormal findings: Secondary | ICD-10-CM | POA: Diagnosis not present

## 2020-10-26 DIAGNOSIS — E782 Mixed hyperlipidemia: Secondary | ICD-10-CM | POA: Diagnosis not present

## 2020-10-26 DIAGNOSIS — E039 Hypothyroidism, unspecified: Secondary | ICD-10-CM | POA: Diagnosis not present

## 2020-10-26 DIAGNOSIS — I10 Essential (primary) hypertension: Secondary | ICD-10-CM | POA: Diagnosis not present

## 2020-11-05 DIAGNOSIS — R319 Hematuria, unspecified: Secondary | ICD-10-CM | POA: Diagnosis not present

## 2020-11-05 DIAGNOSIS — N390 Urinary tract infection, site not specified: Secondary | ICD-10-CM | POA: Diagnosis not present

## 2021-01-11 DIAGNOSIS — Z1231 Encounter for screening mammogram for malignant neoplasm of breast: Secondary | ICD-10-CM | POA: Diagnosis not present

## 2021-02-15 DIAGNOSIS — D229 Melanocytic nevi, unspecified: Secondary | ICD-10-CM | POA: Diagnosis not present

## 2021-02-15 DIAGNOSIS — D1801 Hemangioma of skin and subcutaneous tissue: Secondary | ICD-10-CM | POA: Diagnosis not present

## 2021-02-15 DIAGNOSIS — Z85828 Personal history of other malignant neoplasm of skin: Secondary | ICD-10-CM | POA: Diagnosis not present

## 2021-02-15 DIAGNOSIS — L578 Other skin changes due to chronic exposure to nonionizing radiation: Secondary | ICD-10-CM | POA: Diagnosis not present

## 2021-02-22 DIAGNOSIS — M545 Low back pain, unspecified: Secondary | ICD-10-CM | POA: Diagnosis not present

## 2021-02-22 DIAGNOSIS — M5416 Radiculopathy, lumbar region: Secondary | ICD-10-CM | POA: Diagnosis not present

## 2021-02-22 DIAGNOSIS — M778 Other enthesopathies, not elsewhere classified: Secondary | ICD-10-CM | POA: Diagnosis not present

## 2021-02-22 DIAGNOSIS — M25561 Pain in right knee: Secondary | ICD-10-CM | POA: Diagnosis not present

## 2021-02-22 DIAGNOSIS — I10 Essential (primary) hypertension: Secondary | ICD-10-CM | POA: Diagnosis not present

## 2021-02-22 DIAGNOSIS — M76891 Other specified enthesopathies of right lower limb, excluding foot: Secondary | ICD-10-CM | POA: Diagnosis not present

## 2021-03-01 DIAGNOSIS — M25561 Pain in right knee: Secondary | ICD-10-CM | POA: Diagnosis not present

## 2021-03-01 DIAGNOSIS — M5416 Radiculopathy, lumbar region: Secondary | ICD-10-CM | POA: Diagnosis not present

## 2021-07-12 DIAGNOSIS — L02416 Cutaneous abscess of left lower limb: Secondary | ICD-10-CM | POA: Diagnosis not present

## 2021-07-22 DIAGNOSIS — N3091 Cystitis, unspecified with hematuria: Secondary | ICD-10-CM | POA: Diagnosis not present

## 2021-07-25 DIAGNOSIS — E785 Hyperlipidemia, unspecified: Secondary | ICD-10-CM | POA: Diagnosis not present

## 2021-07-25 DIAGNOSIS — I10 Essential (primary) hypertension: Secondary | ICD-10-CM | POA: Diagnosis not present

## 2021-07-25 DIAGNOSIS — R002 Palpitations: Secondary | ICD-10-CM | POA: Diagnosis not present

## 2021-07-25 DIAGNOSIS — N390 Urinary tract infection, site not specified: Secondary | ICD-10-CM | POA: Diagnosis not present

## 2021-07-25 DIAGNOSIS — R748 Abnormal levels of other serum enzymes: Secondary | ICD-10-CM | POA: Diagnosis not present

## 2021-07-28 DIAGNOSIS — Z6833 Body mass index (BMI) 33.0-33.9, adult: Secondary | ICD-10-CM | POA: Diagnosis not present

## 2021-07-28 DIAGNOSIS — Z01419 Encounter for gynecological examination (general) (routine) without abnormal findings: Secondary | ICD-10-CM | POA: Diagnosis not present

## 2021-08-20 IMAGING — MG MM DIGITAL DIAGNOSTIC UNILAT*L* W/ TOMO W/ CAD
6 of 10 series · 6 of 30 positions shown · non-contrast
Comparison: Previous exam(s).

CLINICAL DATA: Palpable lump in the left axilla.

EXAM:
DIGITAL DIAGNOSTIC LEFT MAMMOGRAM WITH CAD AND TOMO
ULTRASOUND LEFT BREAST

[L CC synth-2D (1 of 2)]
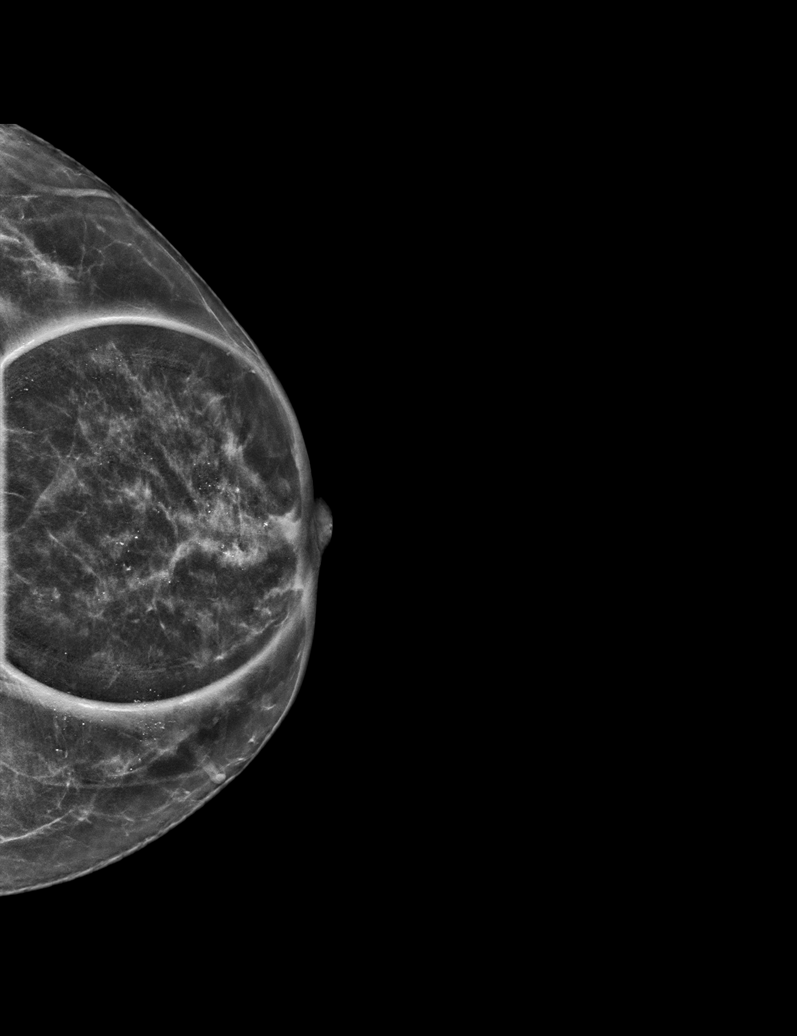

[L MLO synth-2D (1 of 2)]
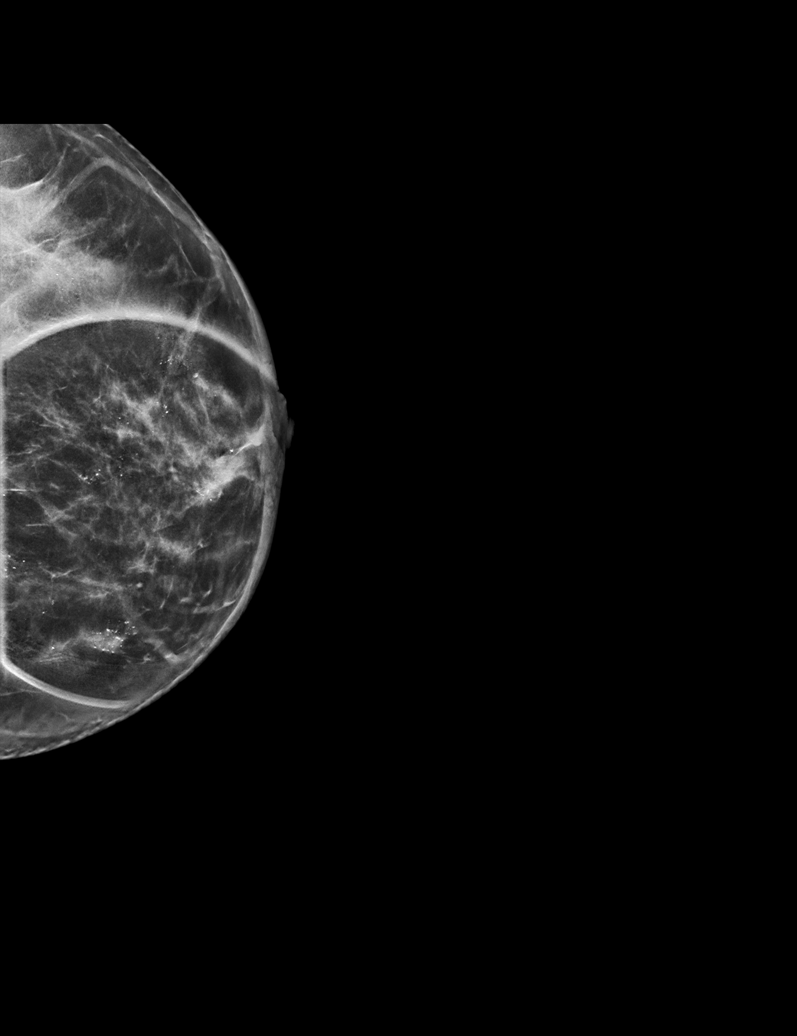

[L MLO synth-2D (2 of 2)]
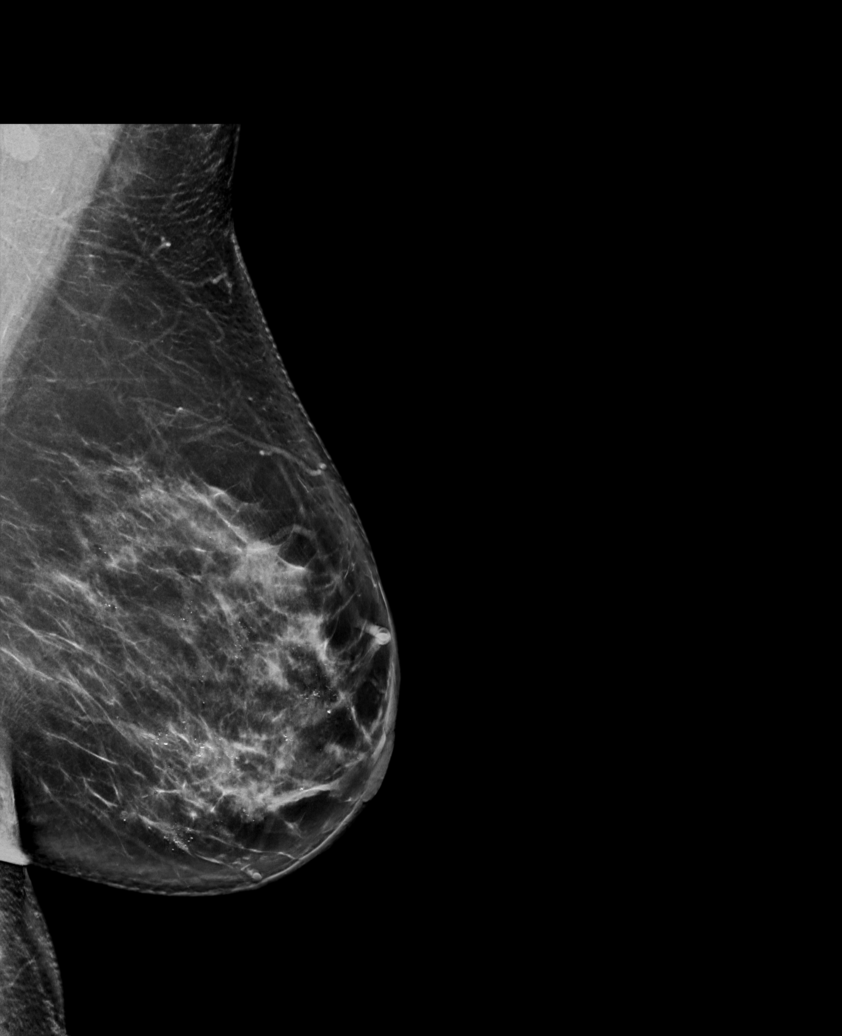

[L CC synth-2D (2 of 2)]
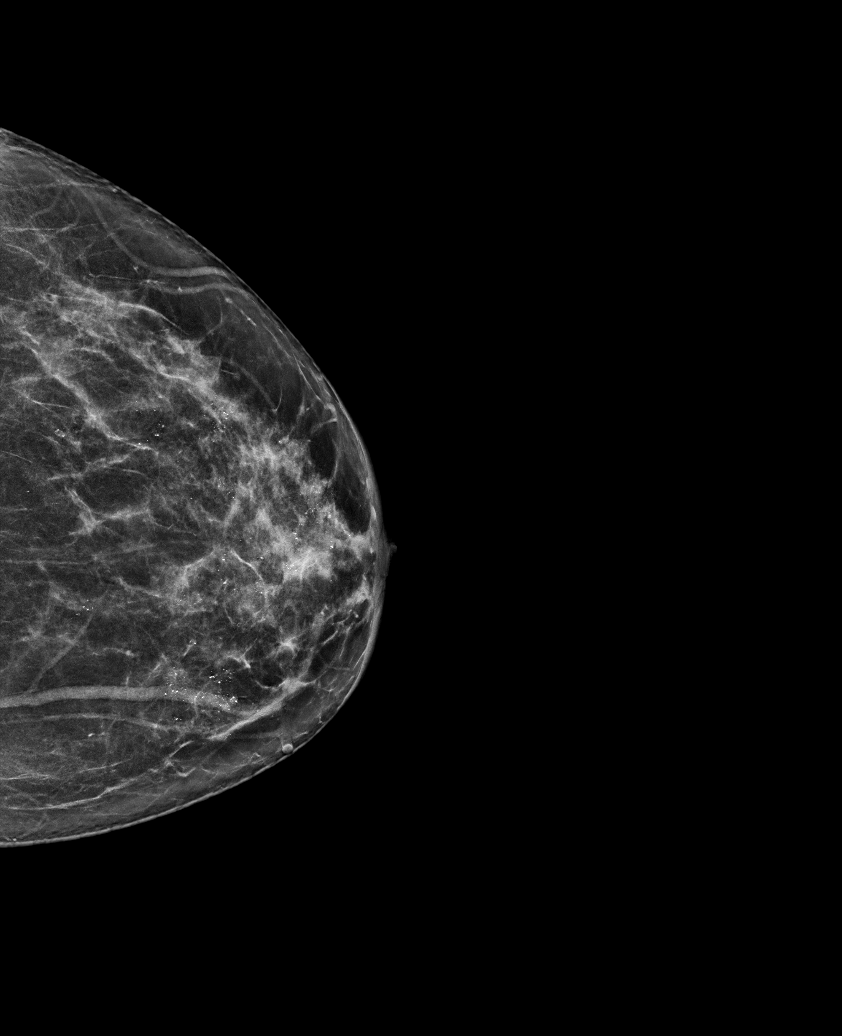

[L TAN synth-2D]
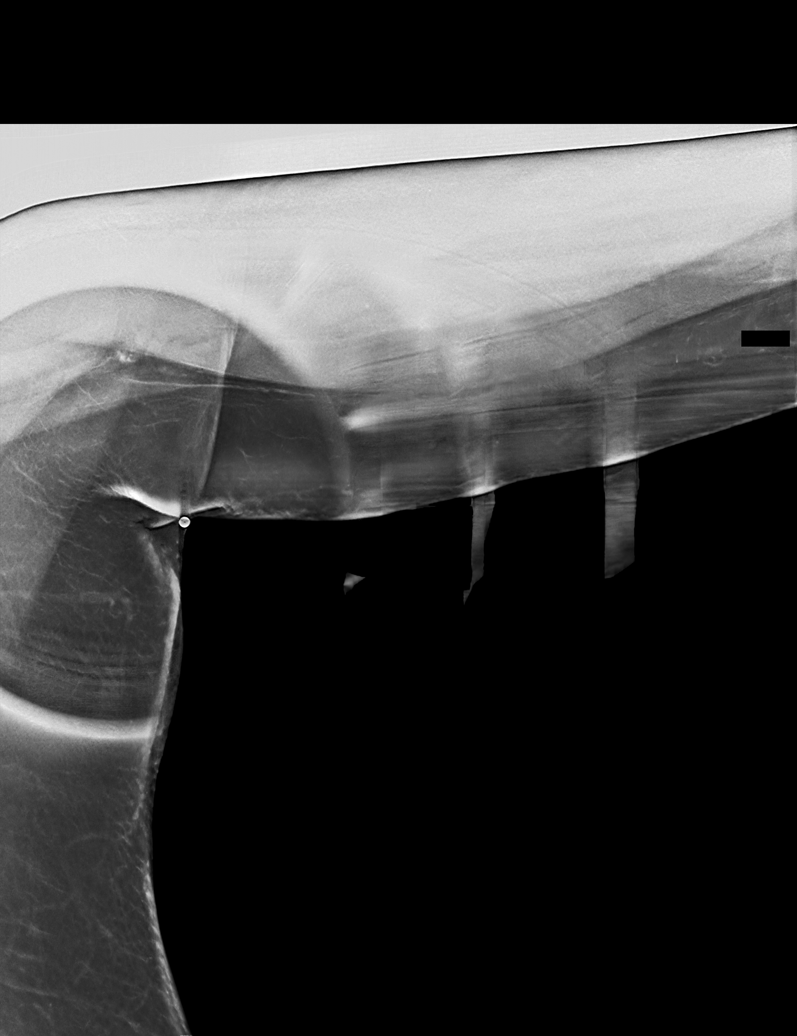

[L MLO tomo · tomo slice 44/87.0]
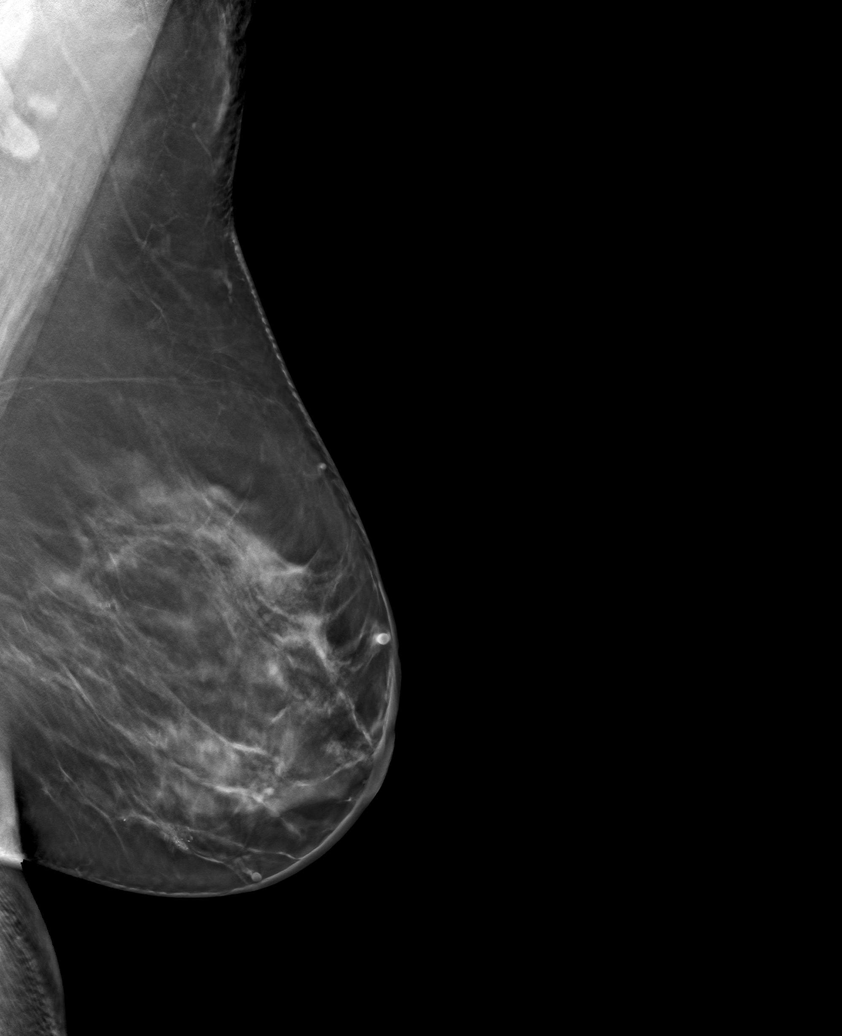

[6 of 30 positions shown; findings below may reference images not displayed]

ACR Breast Density Category c: The breast tissue is heterogeneously
dense, which may obscure small masses.
FINDINGS: Asymmetries in the anterior right breast resolve on additional
imaging. Benign calcifications are stable. No suspicious findings in
the region of the patient's palpable lump in the right axilla on the
spot view.

Mammographic images were processed with CAD.

On physical exam, no suspicious lumps are identified.

Targeted ultrasound is performed, showing normal lymph nodes in
tissue in the region the patient's palpable lump. No suspicious
findings.
IMPRESSION: No mammographic or sonographic evidence of malignancy. No
abnormalities in the region of the patient's palpable lump.

RECOMMENDATION:
Treatment of the patient's palpable lump should be based on clinical
and physical exam given lack of imaging findings. Recommend annual
screening mammography.

I have discussed the findings and recommendations with the patient.
If applicable, a reminder letter will be sent to the patient
regarding the next appointment.

BI-RADS CATEGORY  2: Benign.

## 2021-11-01 DIAGNOSIS — Z0000 Encounter for general adult medical examination without abnormal findings: Secondary | ICD-10-CM | POA: Diagnosis not present

## 2021-11-08 DIAGNOSIS — I10 Essential (primary) hypertension: Secondary | ICD-10-CM | POA: Diagnosis not present

## 2021-11-08 DIAGNOSIS — E782 Mixed hyperlipidemia: Secondary | ICD-10-CM | POA: Diagnosis not present

## 2021-11-08 DIAGNOSIS — M79672 Pain in left foot: Secondary | ICD-10-CM | POA: Diagnosis not present

## 2021-11-08 DIAGNOSIS — D473 Essential (hemorrhagic) thrombocythemia: Secondary | ICD-10-CM | POA: Diagnosis not present

## 2021-11-08 DIAGNOSIS — R3912 Poor urinary stream: Secondary | ICD-10-CM | POA: Diagnosis not present

## 2021-11-08 DIAGNOSIS — Z0000 Encounter for general adult medical examination without abnormal findings: Secondary | ICD-10-CM | POA: Diagnosis not present

## 2021-11-08 DIAGNOSIS — R7303 Prediabetes: Secondary | ICD-10-CM | POA: Diagnosis not present

## 2022-02-16 DIAGNOSIS — D225 Melanocytic nevi of trunk: Secondary | ICD-10-CM | POA: Diagnosis not present

## 2022-02-16 DIAGNOSIS — L738 Other specified follicular disorders: Secondary | ICD-10-CM | POA: Diagnosis not present

## 2022-02-16 DIAGNOSIS — D2261 Melanocytic nevi of right upper limb, including shoulder: Secondary | ICD-10-CM | POA: Diagnosis not present

## 2022-02-16 DIAGNOSIS — Z85828 Personal history of other malignant neoplasm of skin: Secondary | ICD-10-CM | POA: Diagnosis not present

## 2022-03-13 DIAGNOSIS — Z20828 Contact with and (suspected) exposure to other viral communicable diseases: Secondary | ICD-10-CM | POA: Diagnosis not present

## 2022-03-13 DIAGNOSIS — Z1159 Encounter for screening for other viral diseases: Secondary | ICD-10-CM | POA: Diagnosis not present

## 2022-03-13 DIAGNOSIS — J17 Pneumonia in diseases classified elsewhere: Secondary | ICD-10-CM | POA: Diagnosis not present

## 2022-05-05 DIAGNOSIS — J324 Chronic pansinusitis: Secondary | ICD-10-CM | POA: Diagnosis not present

## 2022-05-05 DIAGNOSIS — R5383 Other fatigue: Secondary | ICD-10-CM | POA: Diagnosis not present

## 2022-05-05 DIAGNOSIS — R509 Fever, unspecified: Secondary | ICD-10-CM | POA: Diagnosis not present

## 2022-05-05 DIAGNOSIS — R0981 Nasal congestion: Secondary | ICD-10-CM | POA: Diagnosis not present

## 2022-06-05 DIAGNOSIS — Z1231 Encounter for screening mammogram for malignant neoplasm of breast: Secondary | ICD-10-CM | POA: Diagnosis not present

## 2022-11-28 DIAGNOSIS — Z0000 Encounter for general adult medical examination without abnormal findings: Secondary | ICD-10-CM | POA: Diagnosis not present

## 2022-11-28 DIAGNOSIS — D473 Essential (hemorrhagic) thrombocythemia: Secondary | ICD-10-CM | POA: Diagnosis not present

## 2022-11-28 DIAGNOSIS — E782 Mixed hyperlipidemia: Secondary | ICD-10-CM | POA: Diagnosis not present

## 2022-11-28 DIAGNOSIS — R7303 Prediabetes: Secondary | ICD-10-CM | POA: Diagnosis not present

## 2022-11-28 DIAGNOSIS — I10 Essential (primary) hypertension: Secondary | ICD-10-CM | POA: Diagnosis not present

## 2022-12-05 DIAGNOSIS — D473 Essential (hemorrhagic) thrombocythemia: Secondary | ICD-10-CM | POA: Diagnosis not present

## 2022-12-05 DIAGNOSIS — Z0000 Encounter for general adult medical examination without abnormal findings: Secondary | ICD-10-CM | POA: Diagnosis not present

## 2022-12-05 DIAGNOSIS — I10 Essential (primary) hypertension: Secondary | ICD-10-CM | POA: Diagnosis not present

## 2022-12-05 DIAGNOSIS — E782 Mixed hyperlipidemia: Secondary | ICD-10-CM | POA: Diagnosis not present

## 2023-01-01 ENCOUNTER — Other Ambulatory Visit: Payer: Self-pay | Admitting: Internal Medicine

## 2023-01-01 DIAGNOSIS — R109 Unspecified abdominal pain: Secondary | ICD-10-CM | POA: Diagnosis not present

## 2023-01-01 DIAGNOSIS — K429 Umbilical hernia without obstruction or gangrene: Secondary | ICD-10-CM | POA: Diagnosis not present

## 2023-01-02 ENCOUNTER — Ambulatory Visit
Admission: RE | Admit: 2023-01-02 | Discharge: 2023-01-02 | Disposition: A | Discharge status: Home / Self Care | Payer: BC Managed Care – PPO | Source: Ambulatory Visit | Attending: Internal Medicine | Admitting: Internal Medicine

## 2023-01-02 DIAGNOSIS — Z9889 Other specified postprocedural states: Secondary | ICD-10-CM | POA: Diagnosis not present

## 2023-01-02 DIAGNOSIS — Z9049 Acquired absence of other specified parts of digestive tract: Secondary | ICD-10-CM | POA: Diagnosis not present

## 2023-01-02 DIAGNOSIS — R109 Unspecified abdominal pain: Secondary | ICD-10-CM

## 2023-01-02 DIAGNOSIS — K429 Umbilical hernia without obstruction or gangrene: Secondary | ICD-10-CM | POA: Diagnosis not present

## 2023-01-02 MED ORDER — IOPAMIDOL (ISOVUE-300) INJECTION 61%
100.0000 mL | Freq: Once | INTRAVENOUS | Status: AC | PRN
  Administered 2023-01-02: 100 mL via INTRAVENOUS

## 2023-01-16 DIAGNOSIS — K429 Umbilical hernia without obstruction or gangrene: Secondary | ICD-10-CM | POA: Diagnosis not present

## 2023-02-08 DIAGNOSIS — K429 Umbilical hernia without obstruction or gangrene: Secondary | ICD-10-CM | POA: Diagnosis not present

## 2023-03-06 DIAGNOSIS — I10 Essential (primary) hypertension: Secondary | ICD-10-CM | POA: Diagnosis not present

## 2023-03-06 DIAGNOSIS — K219 Gastro-esophageal reflux disease without esophagitis: Secondary | ICD-10-CM | POA: Diagnosis not present

## 2023-03-06 DIAGNOSIS — E039 Hypothyroidism, unspecified: Secondary | ICD-10-CM | POA: Diagnosis not present

## 2023-03-06 DIAGNOSIS — D75839 Thrombocytosis, unspecified: Secondary | ICD-10-CM | POA: Diagnosis not present

## 2023-03-09 DIAGNOSIS — K429 Umbilical hernia without obstruction or gangrene: Secondary | ICD-10-CM | POA: Diagnosis not present

## 2023-03-09 DIAGNOSIS — Z9889 Other specified postprocedural states: Secondary | ICD-10-CM | POA: Diagnosis not present

## 2023-06-11 DIAGNOSIS — Z1231 Encounter for screening mammogram for malignant neoplasm of breast: Secondary | ICD-10-CM | POA: Diagnosis not present

## 2023-06-11 DIAGNOSIS — Z01419 Encounter for gynecological examination (general) (routine) without abnormal findings: Secondary | ICD-10-CM | POA: Diagnosis not present

## 2023-06-11 DIAGNOSIS — Z124 Encounter for screening for malignant neoplasm of cervix: Secondary | ICD-10-CM | POA: Diagnosis not present

## 2023-06-11 DIAGNOSIS — R3912 Poor urinary stream: Secondary | ICD-10-CM | POA: Diagnosis not present

## 2023-08-07 DIAGNOSIS — D2262 Melanocytic nevi of left upper limb, including shoulder: Secondary | ICD-10-CM | POA: Diagnosis not present

## 2023-08-07 DIAGNOSIS — D2261 Melanocytic nevi of right upper limb, including shoulder: Secondary | ICD-10-CM | POA: Diagnosis not present

## 2023-08-07 DIAGNOSIS — Z85828 Personal history of other malignant neoplasm of skin: Secondary | ICD-10-CM | POA: Diagnosis not present

## 2023-08-07 DIAGNOSIS — D225 Melanocytic nevi of trunk: Secondary | ICD-10-CM | POA: Diagnosis not present

## 2023-09-12 DIAGNOSIS — R109 Unspecified abdominal pain: Secondary | ICD-10-CM | POA: Diagnosis not present

## 2023-09-12 DIAGNOSIS — K5901 Slow transit constipation: Secondary | ICD-10-CM | POA: Diagnosis not present

## 2023-09-12 DIAGNOSIS — K439 Ventral hernia without obstruction or gangrene: Secondary | ICD-10-CM | POA: Diagnosis not present

## 2023-09-12 DIAGNOSIS — K2100 Gastro-esophageal reflux disease with esophagitis, without bleeding: Secondary | ICD-10-CM | POA: Diagnosis not present

## 2023-09-25 DIAGNOSIS — M6208 Separation of muscle (nontraumatic), other site: Secondary | ICD-10-CM | POA: Diagnosis not present

## 2023-10-01 DIAGNOSIS — Z6832 Body mass index (BMI) 32.0-32.9, adult: Secondary | ICD-10-CM | POA: Diagnosis not present

## 2023-10-01 DIAGNOSIS — N390 Urinary tract infection, site not specified: Secondary | ICD-10-CM | POA: Diagnosis not present

## 2023-10-01 DIAGNOSIS — K649 Unspecified hemorrhoids: Secondary | ICD-10-CM | POA: Diagnosis not present

## 2023-10-04 ENCOUNTER — Other Ambulatory Visit: Payer: Self-pay | Admitting: Surgery

## 2023-10-04 DIAGNOSIS — K429 Umbilical hernia without obstruction or gangrene: Secondary | ICD-10-CM

## 2023-10-11 ENCOUNTER — Ambulatory Visit: Payer: BC Managed Care – PPO | Admitting: Physical Therapy

## 2023-10-17 ENCOUNTER — Ambulatory Visit
Admission: RE | Admit: 2023-10-17 | Discharge: 2023-10-17 | Disposition: A | Discharge status: Home / Self Care | Payer: BC Managed Care – PPO | Source: Ambulatory Visit | Attending: Surgery | Admitting: Surgery

## 2023-10-17 DIAGNOSIS — R1013 Epigastric pain: Secondary | ICD-10-CM | POA: Diagnosis not present

## 2023-10-17 DIAGNOSIS — K429 Umbilical hernia without obstruction or gangrene: Secondary | ICD-10-CM

## 2023-10-17 MED ORDER — IOPAMIDOL (ISOVUE-300) INJECTION 61%
100.0000 mL | Freq: Once | INTRAVENOUS | Status: AC | PRN
  Administered 2023-10-17: 100 mL via INTRAVENOUS

## 2023-12-26 DIAGNOSIS — R3911 Hesitancy of micturition: Secondary | ICD-10-CM | POA: Diagnosis not present

## 2023-12-26 DIAGNOSIS — R3916 Straining to void: Secondary | ICD-10-CM | POA: Diagnosis not present

## 2023-12-26 DIAGNOSIS — R3914 Feeling of incomplete bladder emptying: Secondary | ICD-10-CM | POA: Diagnosis not present

## 2024-02-26 DIAGNOSIS — D473 Essential (hemorrhagic) thrombocythemia: Secondary | ICD-10-CM | POA: Diagnosis not present

## 2024-02-26 DIAGNOSIS — E782 Mixed hyperlipidemia: Secondary | ICD-10-CM | POA: Diagnosis not present

## 2024-02-26 DIAGNOSIS — Z0000 Encounter for general adult medical examination without abnormal findings: Secondary | ICD-10-CM | POA: Diagnosis not present

## 2024-02-26 DIAGNOSIS — E039 Hypothyroidism, unspecified: Secondary | ICD-10-CM | POA: Diagnosis not present

## 2024-02-26 DIAGNOSIS — I10 Essential (primary) hypertension: Secondary | ICD-10-CM | POA: Diagnosis not present

## 2024-02-26 DIAGNOSIS — R7303 Prediabetes: Secondary | ICD-10-CM | POA: Diagnosis not present

## 2024-02-28 DIAGNOSIS — D473 Essential (hemorrhagic) thrombocythemia: Secondary | ICD-10-CM | POA: Diagnosis not present

## 2024-02-28 DIAGNOSIS — Z0000 Encounter for general adult medical examination without abnormal findings: Secondary | ICD-10-CM | POA: Diagnosis not present

## 2024-02-28 DIAGNOSIS — I10 Essential (primary) hypertension: Secondary | ICD-10-CM | POA: Diagnosis not present

## 2024-02-28 DIAGNOSIS — R7303 Prediabetes: Secondary | ICD-10-CM | POA: Diagnosis not present

## 2024-03-04 DIAGNOSIS — I10 Essential (primary) hypertension: Secondary | ICD-10-CM | POA: Diagnosis not present

## 2024-03-04 DIAGNOSIS — M545 Low back pain, unspecified: Secondary | ICD-10-CM | POA: Diagnosis not present

## 2024-03-04 DIAGNOSIS — N390 Urinary tract infection, site not specified: Secondary | ICD-10-CM | POA: Diagnosis not present

## 2024-03-04 DIAGNOSIS — Z23 Encounter for immunization: Secondary | ICD-10-CM | POA: Diagnosis not present

## 2024-03-04 DIAGNOSIS — D473 Essential (hemorrhagic) thrombocythemia: Secondary | ICD-10-CM | POA: Diagnosis not present

## 2024-03-04 DIAGNOSIS — Z0000 Encounter for general adult medical examination without abnormal findings: Secondary | ICD-10-CM | POA: Diagnosis not present

## 2024-07-29 DIAGNOSIS — R319 Hematuria, unspecified: Secondary | ICD-10-CM | POA: Diagnosis not present

## 2024-08-26 DIAGNOSIS — Z01419 Encounter for gynecological examination (general) (routine) without abnormal findings: Secondary | ICD-10-CM | POA: Diagnosis not present

## 2024-08-26 DIAGNOSIS — Z1231 Encounter for screening mammogram for malignant neoplasm of breast: Secondary | ICD-10-CM | POA: Diagnosis not present

## 2024-10-28 ENCOUNTER — Other Ambulatory Visit: Payer: Self-pay | Admitting: Internal Medicine

## 2024-10-28 DIAGNOSIS — E041 Nontoxic single thyroid nodule: Secondary | ICD-10-CM

## 2024-10-30 ENCOUNTER — Inpatient Hospital Stay: Admission: RE | Admit: 2024-10-30 | Discharge: 2024-10-30 | Attending: Internal Medicine | Admitting: Internal Medicine

## 2024-10-30 DIAGNOSIS — E042 Nontoxic multinodular goiter: Secondary | ICD-10-CM | POA: Diagnosis not present

## 2024-10-30 DIAGNOSIS — E041 Nontoxic single thyroid nodule: Secondary | ICD-10-CM

## 2024-11-05 ENCOUNTER — Other Ambulatory Visit
